# Patient Record
Sex: Male | Born: 1935 | Race: White | Hispanic: No | Marital: Married | Smoking: Never smoker
Health system: Southern US, Community
[De-identification: ages and names within clinical notes are randomized; demographics above are authoritative.]

## PROBLEM LIST (undated history)

## (undated) DIAGNOSIS — I2510 Atherosclerotic heart disease of native coronary artery without angina pectoris: Secondary | ICD-10-CM

## (undated) DIAGNOSIS — H9190 Unspecified hearing loss, unspecified ear: Secondary | ICD-10-CM

## (undated) DIAGNOSIS — I219 Acute myocardial infarction, unspecified: Secondary | ICD-10-CM

## (undated) DIAGNOSIS — I519 Heart disease, unspecified: Secondary | ICD-10-CM

## (undated) DIAGNOSIS — I499 Cardiac arrhythmia, unspecified: Secondary | ICD-10-CM

## (undated) DIAGNOSIS — K769 Liver disease, unspecified: Secondary | ICD-10-CM

## (undated) DIAGNOSIS — H539 Unspecified visual disturbance: Secondary | ICD-10-CM

## (undated) DIAGNOSIS — I509 Heart failure, unspecified: Secondary | ICD-10-CM

## (undated) DIAGNOSIS — E7800 Pure hypercholesterolemia, unspecified: Secondary | ICD-10-CM

## (undated) DIAGNOSIS — E785 Hyperlipidemia, unspecified: Secondary | ICD-10-CM

## (undated) DIAGNOSIS — N200 Calculus of kidney: Secondary | ICD-10-CM

## (undated) HISTORY — DX: Pure hypercholesterolemia, unspecified: E78.00

## (undated) HISTORY — DX: Acute myocardial infarction, unspecified: I21.9

## (undated) HISTORY — DX: Heart failure, unspecified: I50.9

## (undated) HISTORY — DX: Heart disease, unspecified: I51.9

## (undated) HISTORY — DX: Atherosclerotic heart disease of native coronary artery without angina pectoris: I25.10

## (undated) HISTORY — DX: Hyperlipidemia, unspecified: E78.5

## (undated) HISTORY — PX: ELECTROCARDIOGRAM: SHX264

## (undated) HISTORY — PX: CARDIAC CATHETERIZATION: SHX172

## (undated) HISTORY — DX: Cardiac arrhythmia, unspecified: I49.9

## (undated) HISTORY — DX: Liver disease, unspecified: K76.9

## (undated) HISTORY — PX: OTHER SURGICAL HISTORY: SHX169

## (undated) HISTORY — DX: Unspecified visual disturbance: H53.9

## (undated) HISTORY — DX: Calculus of kidney: N20.0

---

## 2001-01-26 ENCOUNTER — Inpatient Hospital Stay (HOSPITAL_COMMUNITY): Admission: AD | Admit: 2001-01-26 | Discharge: 2001-01-29 | Payer: Self-pay | Admitting: Cardiovascular Disease

## 2001-01-27 ENCOUNTER — Encounter: Payer: Self-pay | Admitting: *Deleted

## 2008-01-16 HISTORY — PX: OTHER SURGICAL HISTORY: SHX169

## 2009-10-09 ENCOUNTER — Observation Stay (HOSPITAL_COMMUNITY): Admission: EM | Admit: 2009-10-09 | Discharge: 2009-10-09 | Payer: Self-pay | Admitting: Emergency Medicine

## 2010-03-30 LAB — COMPREHENSIVE METABOLIC PANEL
ALT: 84 U/L — ABNORMAL HIGH (ref 0–53)
AST: 84 U/L — ABNORMAL HIGH (ref 0–37)
Albumin: 3.6 g/dL (ref 3.5–5.2)
Alkaline Phosphatase: 157 U/L — ABNORMAL HIGH (ref 39–117)
BUN: 8 mg/dL (ref 6–23)
CO2: 31 mEq/L (ref 19–32)
Calcium: 9.2 mg/dL (ref 8.4–10.5)
Chloride: 100 mEq/L (ref 96–112)
Creatinine, Ser: 0.81 mg/dL (ref 0.4–1.5)
GFR calc Af Amer: >60 mL/min (ref >60)
GFR calc non Af Amer: >60 mL/min (ref >60)
Glucose, Bld: 109 mg/dL — ABNORMAL HIGH (ref 70–99)
Potassium: 4.3 mEq/L (ref 3.5–5.1)
Sodium: 139 mEq/L (ref 135–145)
Total Bilirubin: 0.7 mg/dL (ref 0.3–1.2)
Total Protein: 7.3 g/dL (ref 6.0–8.3)

## 2010-03-30 LAB — DIFFERENTIAL
Basophils Absolute: 0 10*3/uL (ref 0.0–0.1)
Basophils Relative: 1 % (ref 0–1)
Eosinophils Absolute: 0.2 10*3/uL (ref 0.0–0.7)
Eosinophils Relative: 3 % (ref 0–5)
Lymphocytes Relative: 26 % (ref 12–46)
Lymphs Abs: 1.6 10*3/uL (ref 0.7–4.0)
Monocytes Absolute: 0.6 10*3/uL (ref 0.1–1.0)
Monocytes Relative: 9 % (ref 3–12)
Neutro Abs: 3.8 10*3/uL (ref 1.7–7.7)
Neutrophils Relative %: 61 % (ref 43–77)

## 2010-03-30 LAB — CBC
HCT: 40.5 % (ref 39.0–52.0)
Hemoglobin: 13.8 g/dL (ref 13.0–17.0)
MCH: 33.3 pg (ref 26.0–34.0)
MCHC: 34.1 g/dL (ref 30.0–36.0)
MCV: 97.8 fL (ref 78.0–100.0)
Platelets: 196 10*3/uL (ref 150–400)
RBC: 4.14 MIL/uL — ABNORMAL LOW (ref 4.22–5.81)
RDW: 12.3 % (ref 11.5–15.5)
WBC: 6.2 10*3/uL (ref 4.0–10.5)

## 2010-06-02 NOTE — Discharge Summary (Signed)
[place]. [facility]  Patient:    Manuel Mcdowell, Manuel Mcdowell Visit Number: [id number] MRN: [id number]          Service Type: MED Location: [id number] Attending Physician:  Darlin Priestly Dictated by:   Abelino Derrick, P.A.-C Admit Date:  01/26/2001 Discharge Date: 01/29/2001   CC:         Romeo Rabon, M.D. [place], [place]   Discharge Summary  DISCHARGE DIAGNOSES: 1. Unstable angina, left anterior descending artery PCI and stenting this    admission. 2. Hyperlipidemia. 3. Transient hypotension and bradycardia, stable at discharge.  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  He was admitted to [facility] on 01/23/01, with substernal chest pain.  He was transferred to [facility] for further evaluation.  The patient was seen by Dr. Jenne Campus on admission.  He was continued on heparin, and set up for catheterization and possible intervention.  The patient is noted to be a [employer] Witness, and desired no blood products.  He was informed of the risks of bleeding, possible death, and the patient indicated he was willing to accept this.  CK-MBs and troponins were negative.  The patient underwent catheterization on 01/27/01, by Dr. Elsie Lincoln which revealed a 30% right coronary artery, normal left main, normal circumflex, a 99% proximal left anterior descending artery.  Ejection fraction was 55%.  He did have some anterior hypokinesis.  The patient underwent stenting of the left anterior descending artery with good results, and tolerated this well.  He did have some transient hypotension and bradycardia, but this resolved with fluids.  He is discharged on 01/28/01, and will follow up with Dr. Elsie Lincoln in [place].  DISCHARGE MEDICATIONS: 1. Zetia 10 mg q.d. 2. Plavix 75 mg p.o. q.d. x1 month. 3.  Niacin 2 g h.s. 4. Toprol XL 25 mg q.d. 5. Coated aspirin q.d. 6. Nitroglycerin sublingual p.r.n.  LABORATORY DATA:  EKG showed sinus rhythm, sinus bradycardia, and small inferior Qs, he does have T-wave inversion in V2.  Chest x-ray shows no acute pulmonary process.  White blood cell count 6.6, hemoglobin 16, hematocrit 45.2, platelets 207.  INR 1.  Sodium 139, potassium 3.9, BUN 11, creatinine 0.9.  Liver function tests are normal.  CKs are negative x2.  Lipid profile showed a cholesterol of 298, triglycerides 812, HDL 46.  Followup lipid profile showed triglycerides of 288, HDL 39, LDL 162.  TSH 5.3.  DISPOSITION:  The patient is discharged in stable condition.  FOLLOWUP:  Dr. Elsie Lincoln.  He should be on Plavix for one month.  He will need a followup lipid profile in about two weeks. Dictated by:   Abelino Derrick, P.A.-C Attending Physician:  Darlin Priestly DD:  02/06/01 TD:  02/07/01 Job: [id number]

## 2010-06-02 NOTE — Cardiovascular Report (Signed)
[name]. [facility]  Patient:    Manuel Mcdowell, Manuel Mcdowell Visit Number: [id number] MRN: [id number]          Service Type: MED Location: [id number] Attending Physician:  Darlin Priestly Dictated by:   Madaline Savage, M.D. Proc. Date: 01/27/01 Admit Date:  01/26/2001 Discharge Date: 01/29/2001   CC:         Cardiac Catheterization Laboratory   Cardiac Catheterization  PROCEDURES PERFORMED: 1. Selective coronary angiography by Judkins technique. 2. Retrograde left heart catheterization. 3. Left ventricular angiography. 4. Percutaneous coronary artery stenting of the proximal left anterior    descending coronary artery.  COMPLICATIONS:  None.  ENTRY SITE:  Right femoral.  DYE USED:  Omnipaque.  PATIENT PROFILE:  The patient is a very pleasant 76 year old white married male from [place] who was hospitalized recently at [facility] for unstable angina.  Apparently, cardiac enzymes were negative.  He was given the option of undergoing catheterization there and chose to come to [facility] instead and was transported by emergency medical transport.  He has been hospitalized over the weekend and enters the catheterization laboratory for diagnostic intervention and possible percutaneous intervention as well. Integrilin had been infusing for his stable angina which was fairly stable.  MEDICATIONS GIVEN:  Heparin 3600 units.  DIAGNOSTIC PROCEDURE RESULTS:  HEMODYNAMICS:  Left ventricular pressure was 110/7, central aortic pressure 105/50 with a mean of 75.  No significant aortic valve gradient by pullback technique.  ANGIOGRAPHIC RESULTS: 1. The left main coronary artery contained no significant calcification and    was a smooth vessel, fairly short in length. 2. The left anterior descending artery coursed to the cardiac apex and gave    rise to one medium sized diagonal branch arising in the mid portion of the    vessel and  several smaller diagonal branches that were fairly tiny in size.    The LAD contained a type C lesion of a segmental nature in the proximal    portion of the vessel just before the septal perforator branch.  There    appeared to be some ulceration in the plaque which was fairly complex,    however, the area just beyond the stenotic vessel appeared fairly smooth    and normal in appearance.  The medium sized diagonal branch, although small    to medium in size, did not show any high-grade lesions. 3. The left circumflex was nondominant, showing what appeared to be a    bifurcating major obtuse marginal branch arising near the proximal portion    of the vessel.  The atrioventricular groove circumflex was fairly small in    diameter.  No significant lesions were seen in the circumflex or its obtuse    marginal branch. 4. The right coronary artery contained faint calcification proximally.  There    was a small amount of plaque formation in the proximal portion of the RCA,    well before the first acute marginal branch, but well after the sinus node    branch.  The irregular portion of the vessel here was 20-30% irregular.    The mid and distal RCA, which was a dominant vessel, was smooth and normal.    There were intracoronary collaterals from the septal perforator branches to    the LAD.  These were small to fair sized collaterals.  LEFT VENTRICULOGRAPHY:  The left ventricle showed globally good LV function. I would estimate ejection fraction at 50%.  There was moderate  anterolateral wall hypokinesis involving the lower two-thirds of the anterior wall. Inferior wall apex and basal segments all contracted normally.  No mitral regurgitation seen.  No obvious LV thrombus noted.  PERCUTANEOUS INTERVENTION:  This was performed with 7-French catheters.  The guide catheter used was a 7-French Judkins #4 left.  The guide wire was a high-torque floppy wire, 190 cm in length.  The predilatation  balloon was a 2.5 x 15 mm Maverick balloon, which was successful at creating a satisfactory lumen for stent delivery.  The inflation pressure was to 6 atm to 8 atm for approximately 60 seconds.  Next, a BX Velocity stent, 2.5 x 18 mm, was deployed across the stenotic vessel, making sure that the distal end of the stent arose significantly well before the diagonal branch arising distal to it.  This stent was deployed first to 16 atm for 75 seconds.  I then postdilated with a 3.0 x 15 mm PowerSail balloon at 14 atm for 60 seconds, which gave an excellent result.  There was now noted to be some irregularity in the LAD just distal to the stent, so that same PowerSail balloon was delivered half inside the distal stent and half inside the LAD distal to the stent and inflated to 8 atm for 60 seconds.  After this maneuver was completed, the LAD was viewed in multiple projections.  It was felt that the 95% stenosis in the proximal LAD was reduced to 0% residual.  FINAL DIAGNOSES: 1. Primarily single-vessel coronary artery disease. 2. Well preserved left ventricular systolic function with mild anterolateral    hypokinesis. 3. Successful percutaneous intervention on proximal left anterior    descending artery, with reduction of a 95% to 0% residual with an    intracoronary artery stent.  PLAN:  The patient will be supported with aspirin and Plavix for the next month and he will be followed in the future to make sure his RCA lesion which is early is not hemodynamically significant. Dictated by:   Madaline Savage, M.D. Attending Physician:  Darlin Priestly DD:  03/04/01 TD:  03/04/01 Job: [id number]

## 2012-05-22 IMAGING — CT CT ABD-PELV W/O CM
2 of 4 series · 17 of 46 positions shown, 19 images · non-contrast
Comparison: None

CLINICAL DATA: Back pain

CT ABDOMEN AND PELVIS WITHOUT CONTRAST
TECHNIQUE: Multidetector CT imaging of the abdomen and pelvis was
performed following the standard protocol without intravenous
contrast.

[Series 2: a/p w/o 5.0 b31f st · axial · non-contrast · 0.67mm/px · z∈[+1193,+1608]mm · 14 of 91 slices shown, 16 images]
[im 4/91  soft-tissue]
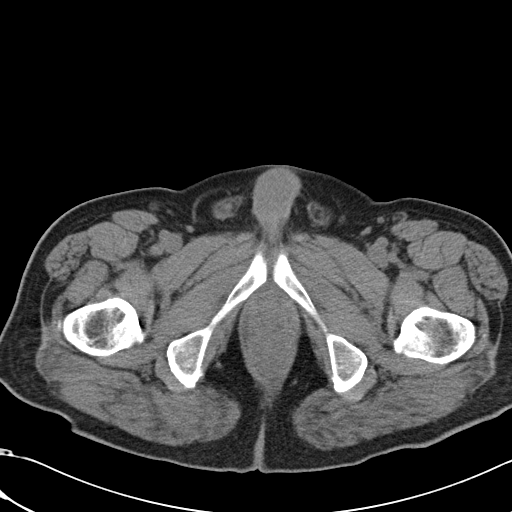
[im 4/91  bone]
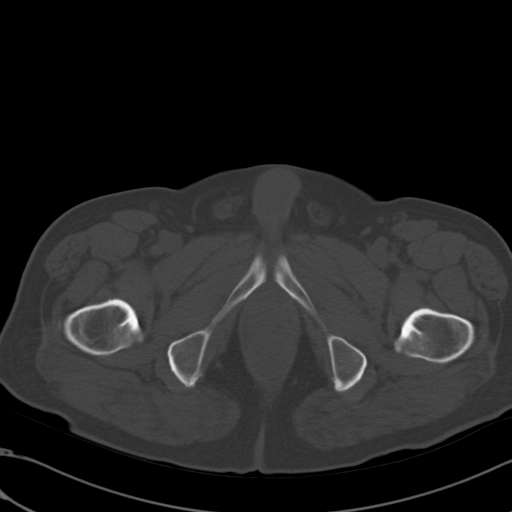
[im 12/91  soft-tissue]
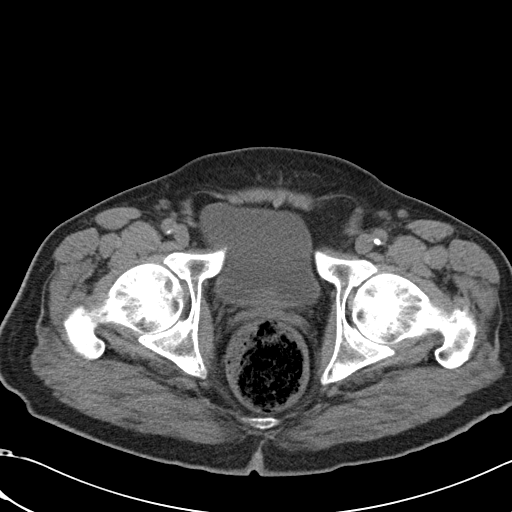
[im 16/91  soft-tissue]
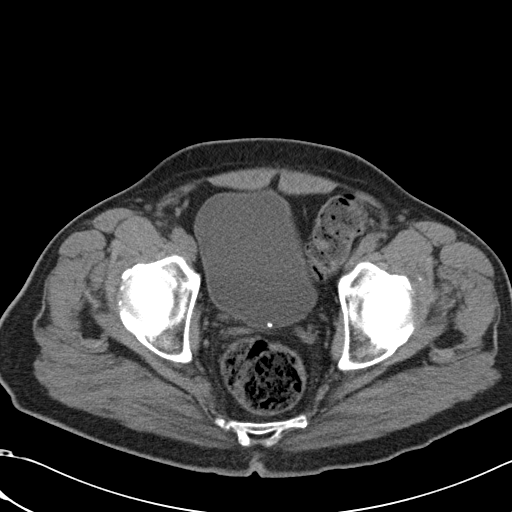
[im 24/91  soft-tissue]
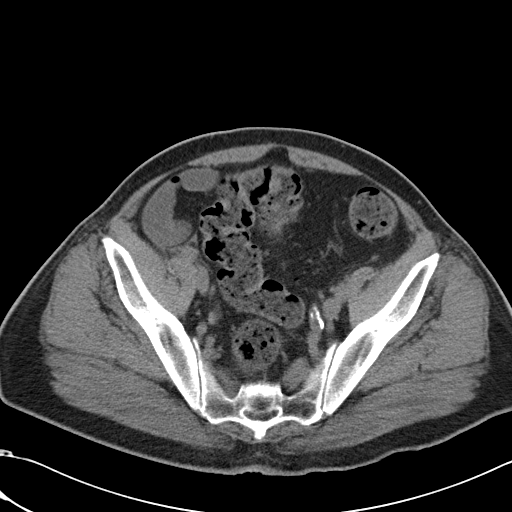
[im 32/91  soft-tissue]
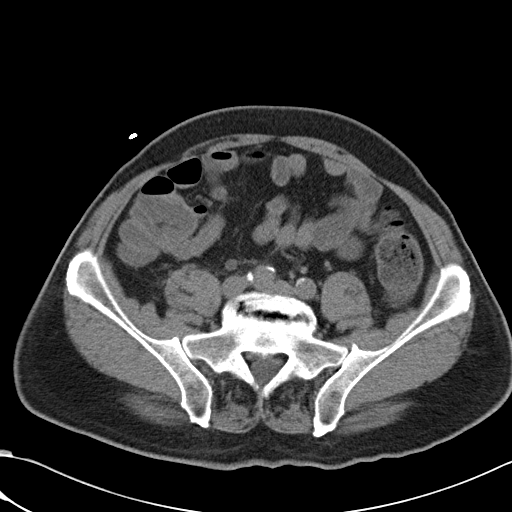
[im 36/91  soft-tissue]
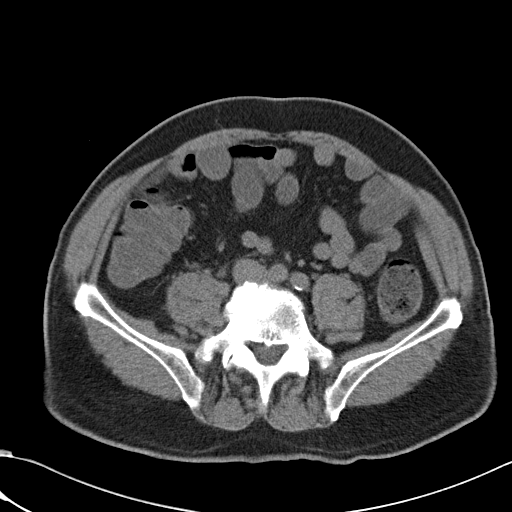
[im 44/91  soft-tissue]
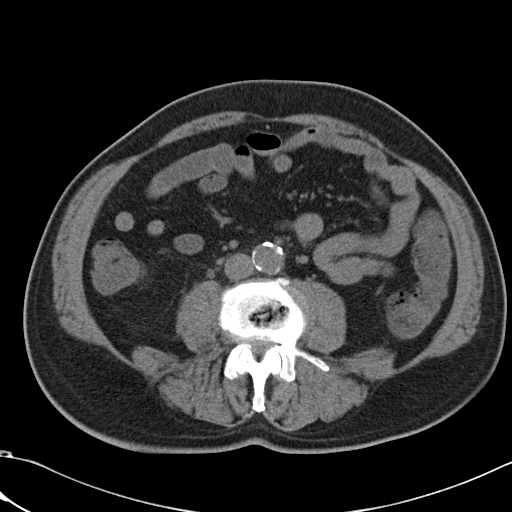
[im 47/91  soft-tissue]
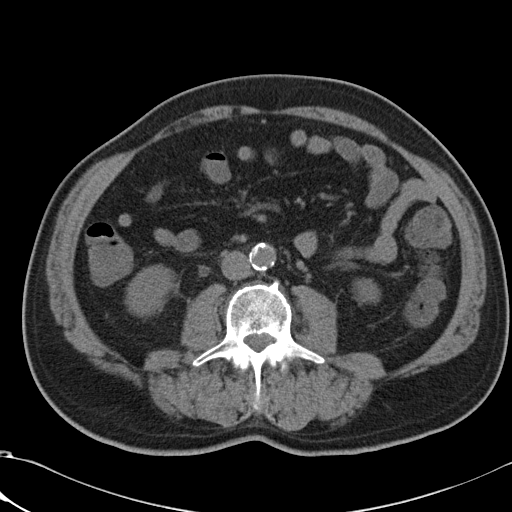
[im 55/91  soft-tissue]
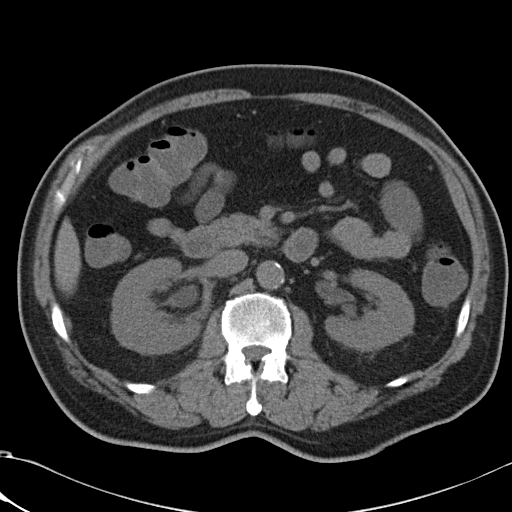
[im 55/91  bone]
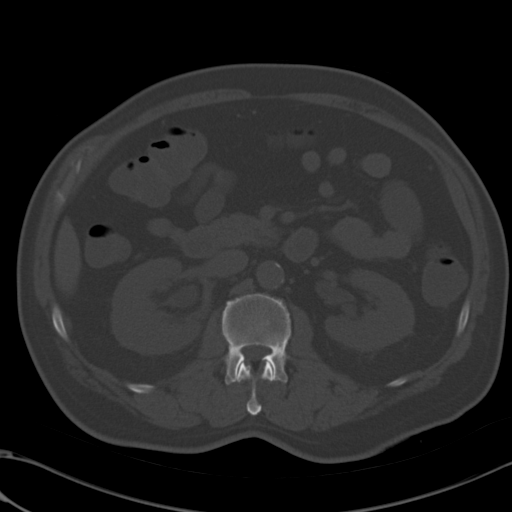
[im 59/91  soft-tissue]
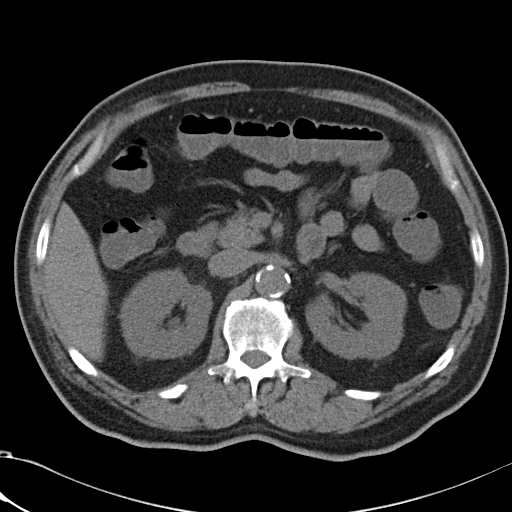
[im 67/91  soft-tissue]
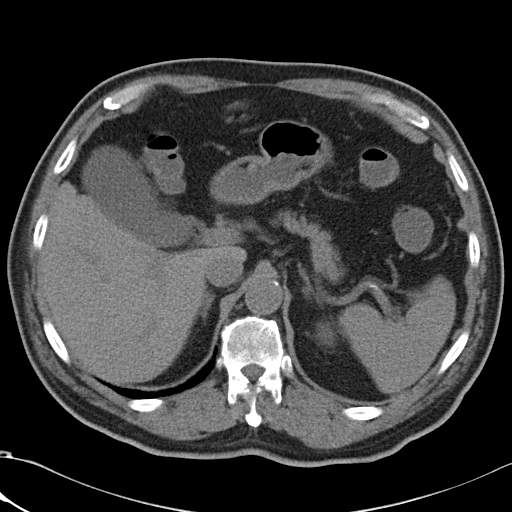
[im 75/91  soft-tissue]
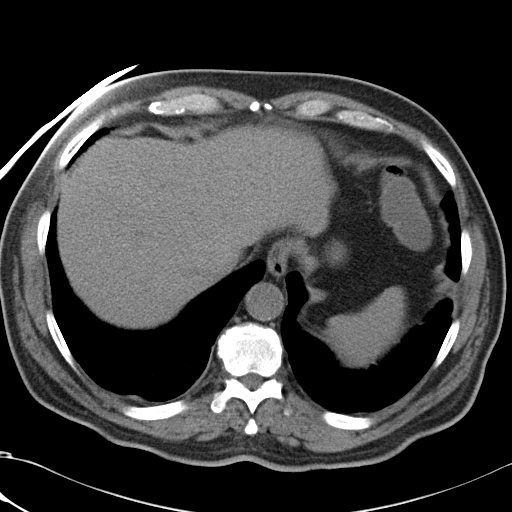
[im 79/91  soft-tissue]
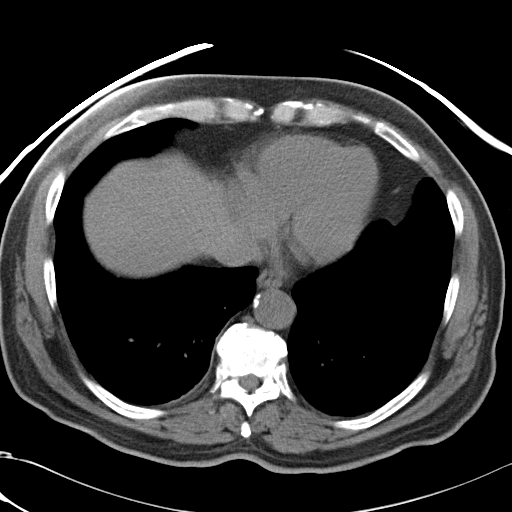
[im 87/91  soft-tissue]
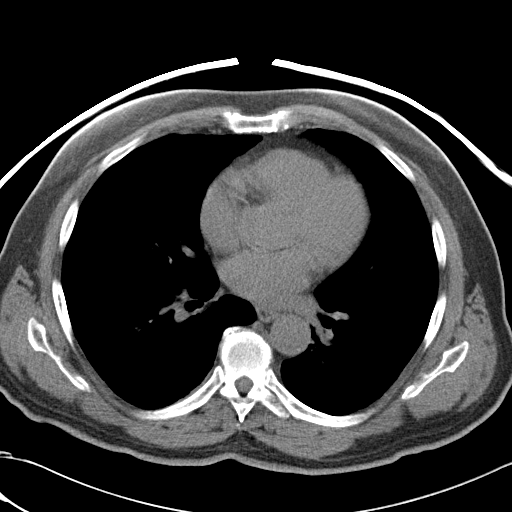

[Series 602: cor · coronal · 0.89mm/px · 3 of 123 slices shown]
[im 41/123  soft-tissue]
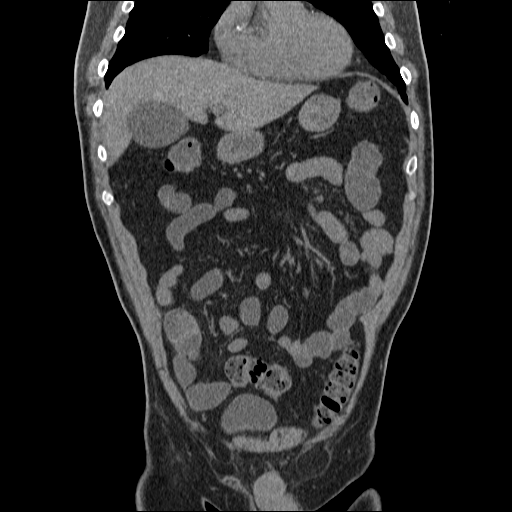
[im 55/123  soft-tissue]
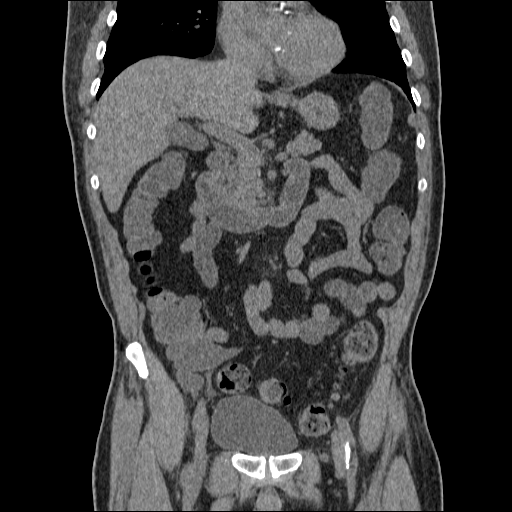
[im 68/123  soft-tissue]
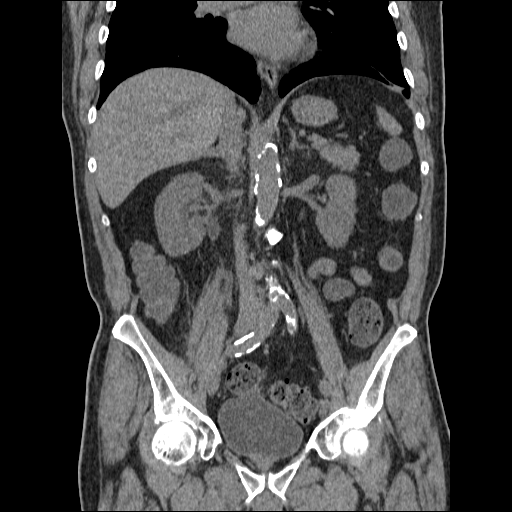

[17 of 46 positions shown; findings below may reference images not displayed]

FINDINGS: Visualized lung bases are clear.

Liver is normal.

Spleen is normal.

Pancreas is normal.

The adrenal glands are normal.

 The left kidney appears normal.

Within the upper pole of the right kidney there is a stone
measuring 4 mm, image 35. There is right-sided pelvocaliectasis and
right hydroureter.  No ureterolithiasis noted.

Within the dependent portion of the bladder there is a stone
measuring 3.8 mm.

Gallbladder is unremarkable by CT.

No biliary ductal dilation.

Stomach and visualized large and small bowel are unremarkable.

Advanced calcified atherosclerotic disease affects the abdominal
aorta.  No aneurysm identified.

No significant lymphadenopathy.

No free fluid or abnormal fluid collections.

The pelvic bowel loops appear normal.

No enlarged pelvic lymph nodes.

There is a fat containing left inguinal hernia.
IMPRESSION: 1.  Stone within the dependent portion of the urinary bladder is
identified.  There is  right-sided pelvocaliectasis and right
hydroureter.
2.  4 mm nonobstructing stone is identified within the upper pole
the right kidney.

## 2012-05-22 IMAGING — US US ABDOMEN COMPLETE
1 series · 14 of 25 positions shown · non-contrast
Comparison: CT stone study 10/09/2009

CLINICAL DATA: Kidney stone.  The right upper quadrant pain.

ABDOMINAL ULTRASOUND COMPLETE

[Series 1: us abdomen complete · 0.30mm/px · 14 of 50 slices shown]
[im 1/50]
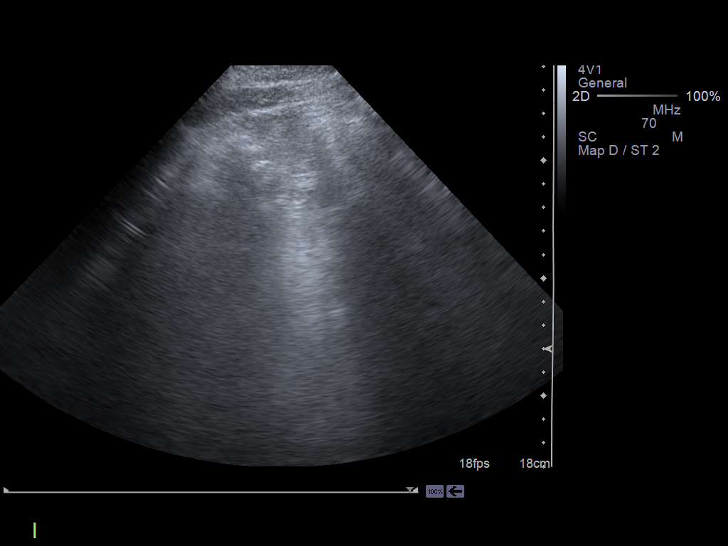
[im 5/50]
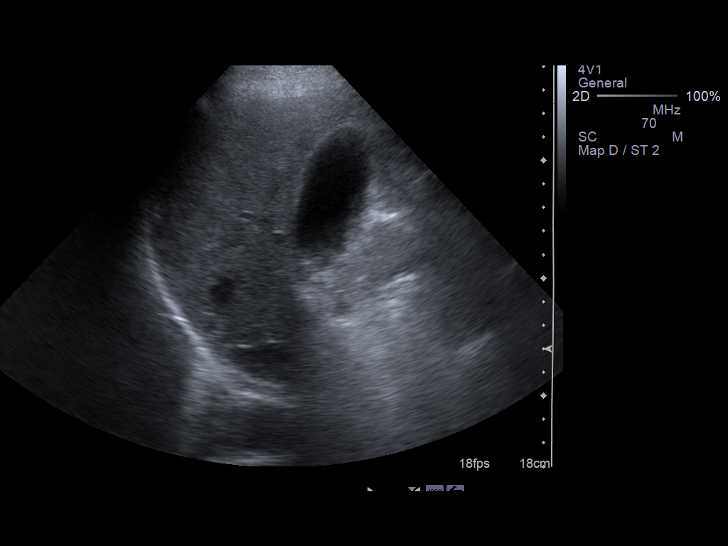
[im 9/50]
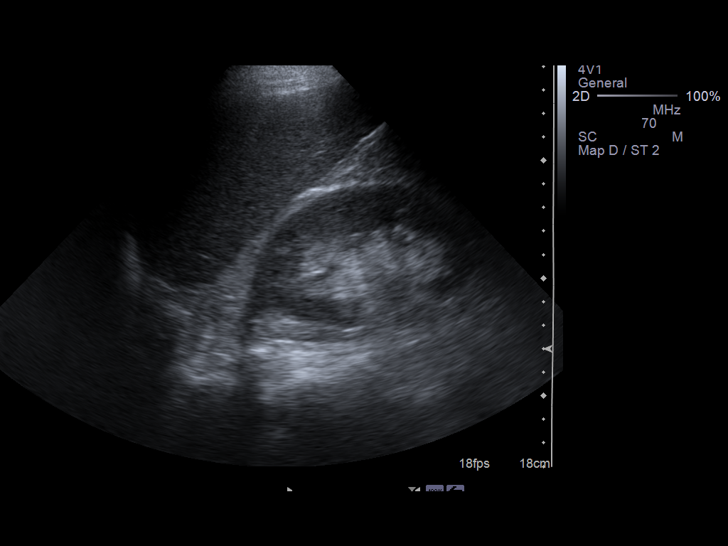
[im 13/50]
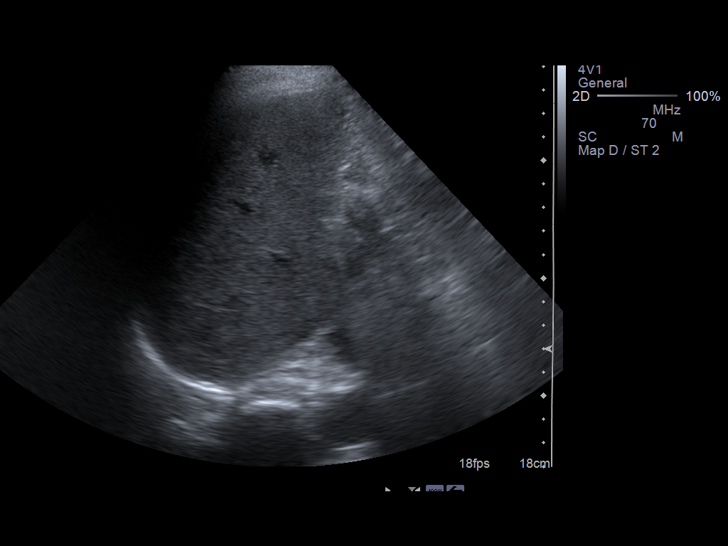
[im 17/50]
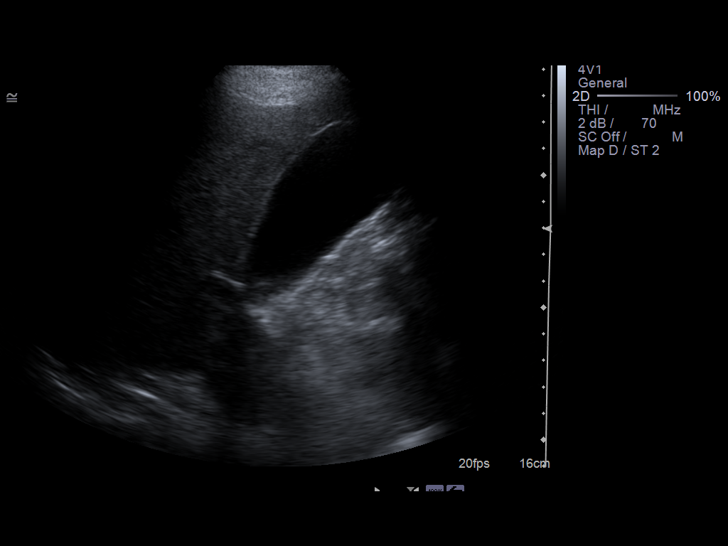
[im 19/50]
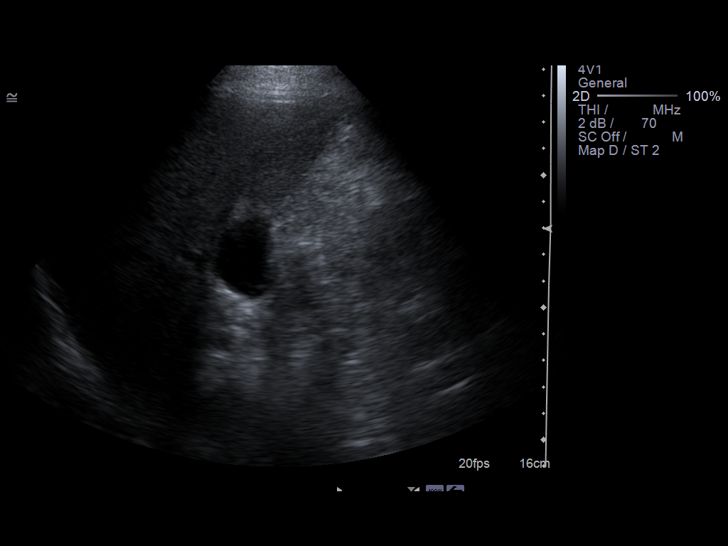
[im 23/50]
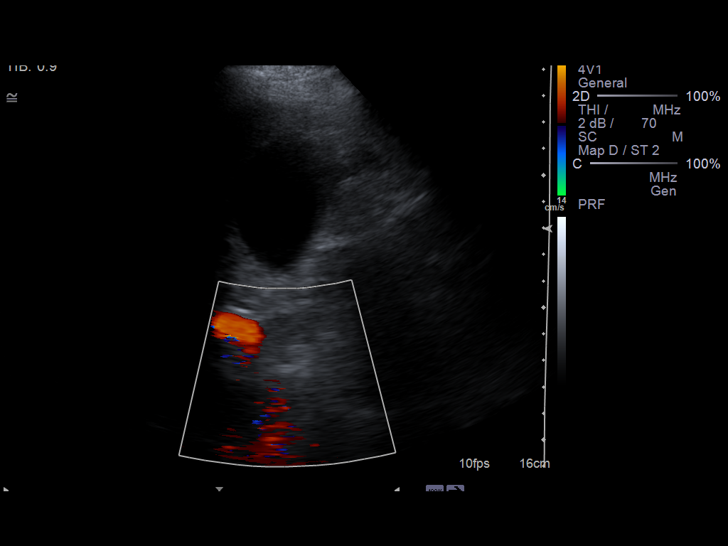
[im 27/50]
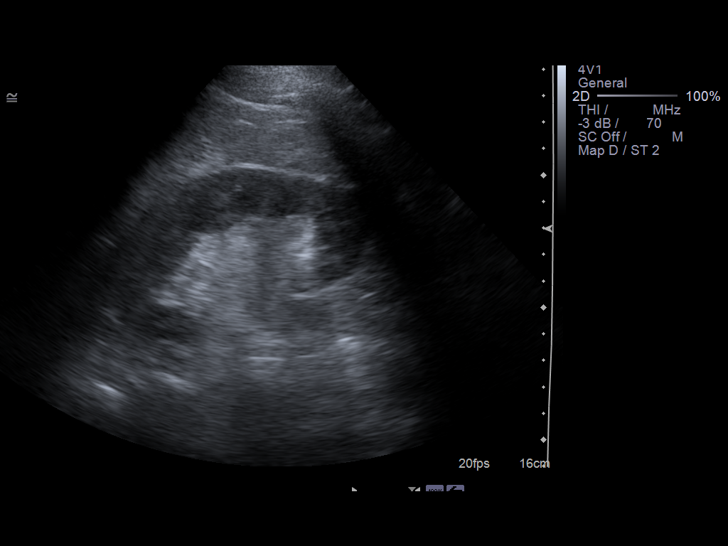
[im 31/50]
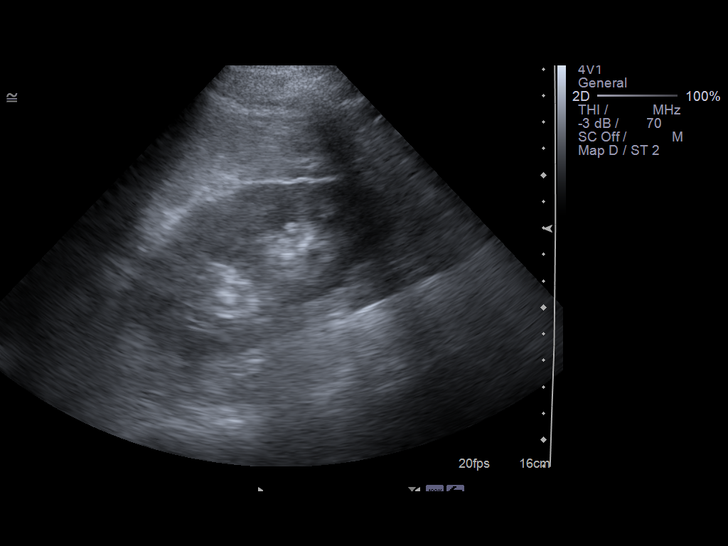
[im 33/50]
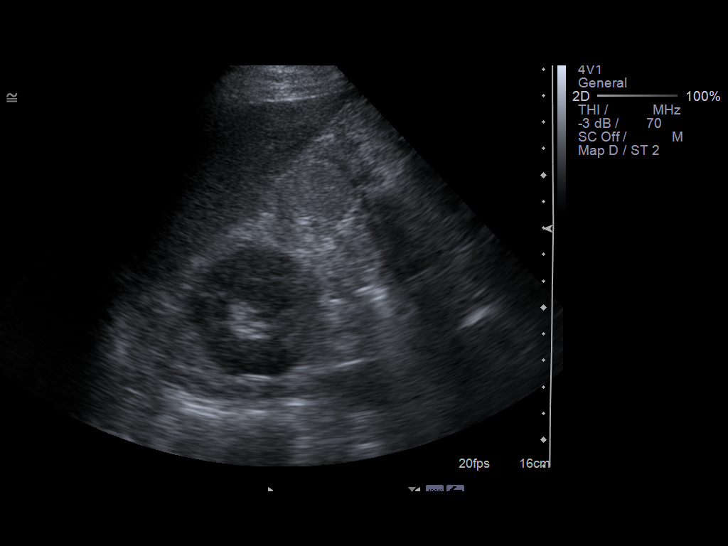
[im 37/50]
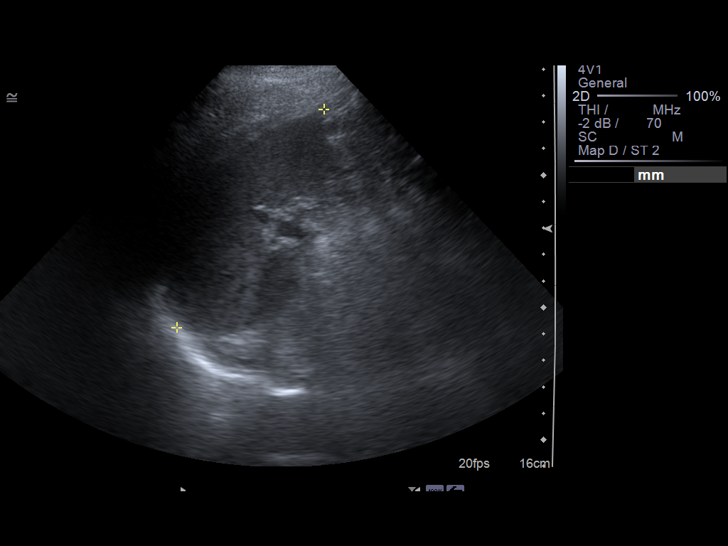
[im 41/50]
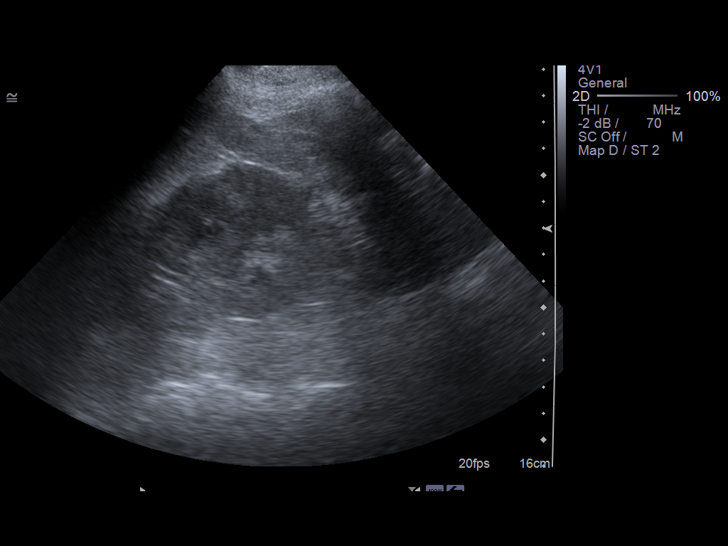
[im 45/50]
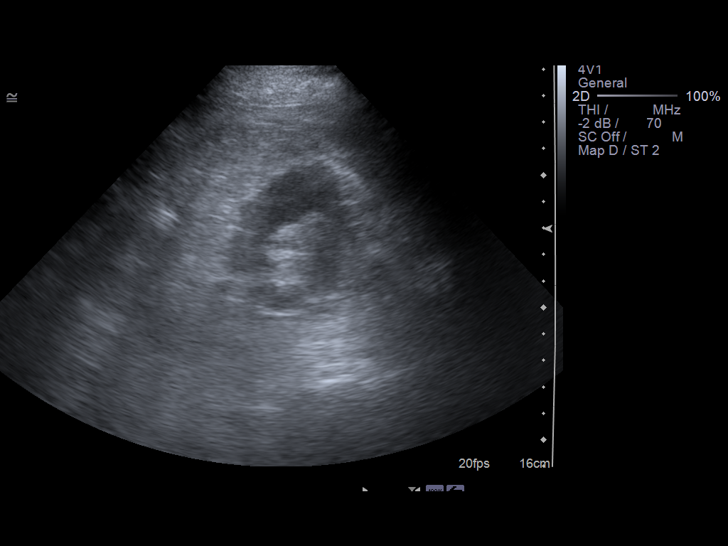
[im 50/50]
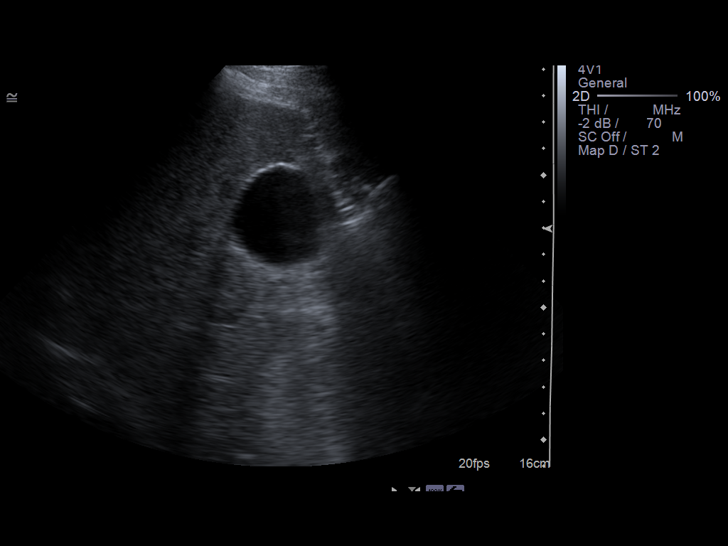

[14 of 25 positions shown; findings below may reference images not displayed]

FINDINGS: Patient was not n.p.o. for this examination.  Prominent amount of
bowel gas was noted by the technologist.

Gallbladder:  No gallstones, gallbladder wall thickening, or
pericholecystic fluid.

Common Bile Duct:  Within normal limits in caliber.

Liver: No focal mass lesion identified.  Within normal limits in
parenchymal echogenicity.

IVC:  Appears normal.

Pancreas:  No abnormality identified.

Spleen:  Within normal limits in size and echotexture.

Right kidney:  Normal in size and parenchymal echogenicity.  No
evidence of mass.  Mild pelviectasis.  Negative for hydronephrosis.

Left kidney:  Normal in size and parenchymal echogenicity.  No
evidence of mass or hydronephrosis.

Abdominal Aorta:  Not visualized.
IMPRESSION: Technically challenging due to bowel gas.

1.  Pelviectasis of the right kidney.  No dilatation of the
calyces.
2.  Known right renal stones seen on today's CT stone study is not
well visualized on ultrasound.
3.  Gallbladder within normal limits.  No biliary ductal
dilatation.

## 2012-12-02 ENCOUNTER — Encounter: Payer: Self-pay | Admitting: Cardiovascular Disease

## 2012-12-02 ENCOUNTER — Ambulatory Visit (INDEPENDENT_AMBULATORY_CARE_PROVIDER_SITE_OTHER): Payer: Medicare Other | Admitting: Cardiovascular Disease

## 2012-12-02 VITALS — BP 116/70 | HR 55 | Resp 12 | Ht 66.0 in | Wt 157.6 lb

## 2012-12-02 DIAGNOSIS — E785 Hyperlipidemia, unspecified: Secondary | ICD-10-CM

## 2012-12-02 DIAGNOSIS — I2510 Atherosclerotic heart disease of native coronary artery without angina pectoris: Secondary | ICD-10-CM

## 2012-12-02 NOTE — Patient Instructions (Signed)
Dr. Croitoru recommends that you schedule a follow-up appointment in: One year.   

## 2012-12-06 ENCOUNTER — Encounter: Payer: Self-pay | Admitting: Cardiovascular Disease

## 2012-12-06 DIAGNOSIS — E7800 Pure hypercholesterolemia, unspecified: Secondary | ICD-10-CM | POA: Insufficient documentation

## 2012-12-06 DIAGNOSIS — I2510 Atherosclerotic heart disease of native coronary artery without angina pectoris: Secondary | ICD-10-CM

## 2012-12-06 DIAGNOSIS — E785 Hyperlipidemia, unspecified: Secondary | ICD-10-CM

## 2012-12-06 HISTORY — DX: Atherosclerotic heart disease of native coronary artery without angina pectoris: I25.10

## 2012-12-06 HISTORY — DX: Hyperlipidemia, unspecified: E78.5

## 2012-12-06 NOTE — Progress Notes (Signed)
Patient ID: Manuel Mcdowell, male   DOB: June 18, 1934, 78 y.o.   MRN: [id number]      Reason for office visit CAD, hyperlipidemia  Despite his intolerance to almost any lipid lowering treatment, 11 years have passed uneventfully since Mr. Lindo received his LAD stent. He continues to work very hard in his yard and has no cardiovascular complaints.  Attempts at treatment with simvastatin, pravastatin, rosuvastatin, Welchol, Zetia and Niacin were all unsuccessful due to side effects. He does tolerate red yeast rice with CoQ10. He has resting bradycardia and is intolerant to beta blockers.   Allergies  Allergen Reactions  . Statins Anaphylaxis    Myalgias   . Sulfa Antibiotics Hives    Current Outpatient Prescriptions  Medication Sig Dispense Refill  . aspirin 325 MG tablet Take 325 mg by mouth daily.      . cholecalciferol (VITAMIN D) 400 UNITS TABS tablet Take 400 Units by mouth daily.      Marland Kitchen CINNAMON PO Take by mouth daily.      . Flaxseed, Linseed, (FLAX SEED OIL PO) Take 2 tablets by mouth daily.      . Probiotic Product (PROBIOTIC DAILY PO) Take by mouth daily.      . Red Yeast Rice 600 MG CAPS Take 2 capsules by mouth daily.       No current facility-administered medications for this visit.    Past Medical History  Diagnosis Date  . CAD (coronary artery disease) s/p LAD stent 2003 12/06/2012    LAD stent (bare metal BXVelocity 2.5x18), 2003  . Hyperlipidemia 12/06/2012    Statin intolerant - myalgia and LFT elevation, Zetia intolerant -myalgia, Niacin intolerant    No past surgical history on file.  No family history on file.  History   Social History  . Marital Status: Married    Spouse Name: N/A    Number of Children: N/A  . Years of Education: N/A   Occupational History  . Not on file.   Social History Main Topics  . Smoking status: Never Smoker   . Smokeless tobacco: Not on file  . Alcohol Use: Yes     Comment: occas. wine  . Drug Use: No  . Sexual  Activity: Not on file   Other Topics Concern  . Not on file   Social History Narrative  . No narrative on file    Review of systems: The patient specifically denies any chest pain at rest or with exertion, dyspnea at rest or with exertion, orthopnea, paroxysmal nocturnal dyspnea, syncope, palpitations, focal neurological deficits, intermittent claudication, lower extremity edema, unexplained weight gain, cough, hemoptysis or wheezing.  The patient also denies abdominal pain, nausea, vomiting, dysphagia, diarrhea, constipation, polyuria, polydipsia, dysuria, hematuria, frequency, urgency, abnormal bleeding or bruising, fever, chills, unexpected weight changes, mood swings, change in skin or hair texture, change in voice quality, auditory or visual problems, allergic reactions or rashes, new musculoskeletal complaints other than usual "aches and pains".   PHYSICAL EXAM BP 116/70  Pulse 55  Resp 12  Ht 5\' 6"  (1.676 m)  Wt 157 lb 9.6 oz (71.487 kg)  BMI 25.45 kg/m2  General: Alert, oriented x3, no distress Head: no evidence of trauma, PERRL, EOMI, no exophtalmos or lid lag, no myxedema, no xanthelasma; normal ears, nose and oropharynx Neck: normal jugular venous pulsations and no hepatojugular reflux; brisk carotid pulses without delay and no carotid bruits Chest: clear to auscultation, no signs of consolidation by percussion or palpation, normal fremitus, symmetrical and full  respiratory excursions Cardiovascular: normal position and quality of the apical impulse, regular rhythm, normal first and second heart sounds, no murmurs, rubs or gallops Abdomen: no tenderness or distention, no masses by palpation, no abnormal pulsatility or arterial bruits, normal bowel sounds, no hepatosplenomegaly Extremities: no clubbing, cyanosis or edema; 2+ radial, ulnar and brachial pulses bilaterally; 2+ right femoral, posterior tibial and dorsalis pedis pulses; 2+ left femoral, posterior tibial and dorsalis  pedis pulses; no subclavian or femoral bruits Neurological: grossly nonfocal   EKG: Sinus bradycardia, normal  Lipid Panel Chol 250, LDL 178, HDL 47 TG 122   BMET    Component Value Date/Time   NA 139 10/09/2009 1716   K 4.3 10/09/2009 1716   CL 100 10/09/2009 1716   CO2 31 10/09/2009 1716   GLUCOSE 109* 10/09/2009 1716   BUN 8 10/09/2009 1716   CREATININE 0.81 10/09/2009 1716   CALCIUM 9.2 10/09/2009 1716   GFRNONAA >60 10/09/2009 1716   GFRAA  Value: >60        The eGFR has been calculated using the MDRD equation. This calculation has not been validated in all clinical situations. eGFR's persistently <60 mL/min signify possible Chronic Kidney Disease. 10/09/2009 1716     ASSESSMENT AND PLAN Mr. Hanken has succeeded in staving off new arterial problems despite an unfavorable lipid profile. This is probably attributable to his attention to diet and daily intense physical activity He is asymptomatic. No changes are made to his medications. When PSCK9 agents become available for clinical use, he should try them.  Orders Placed This Encounter  Procedures  . EKG 12-Lead   Meds ordered this encounter  Medications  . aspirin 325 MG tablet    Sig: Take 325 mg by mouth daily.  . Flaxseed, Linseed, (FLAX SEED OIL PO)    Sig: Take 2 tablets by mouth daily.  . Red Yeast Rice 600 MG CAPS    Sig: Take 2 capsules by mouth daily.  . Probiotic Product (PROBIOTIC DAILY PO)    Sig: Take by mouth daily.  Marland Kitchen CINNAMON PO    Sig: Take by mouth daily.  . cholecalciferol (VITAMIN D) 400 UNITS TABS tablet    Sig: Take 400 Units by mouth daily.    Junious Silk, MD, [facility] [phone] office [phone] pager

## 2012-12-08 ENCOUNTER — Encounter: Payer: Self-pay | Admitting: Cardiovascular Disease

## 2013-12-14 ENCOUNTER — Ambulatory Visit: Payer: Medicare Other | Admitting: Cardiovascular Disease

## 2014-01-27 ENCOUNTER — Ambulatory Visit: Payer: Medicare Other | Admitting: Cardiovascular Disease

## 2014-03-11 ENCOUNTER — Encounter: Payer: Self-pay | Admitting: Cardiovascular Disease

## 2014-03-11 ENCOUNTER — Ambulatory Visit (INDEPENDENT_AMBULATORY_CARE_PROVIDER_SITE_OTHER): Payer: Medicare Other | Admitting: Cardiovascular Disease

## 2014-03-11 VITALS — BP 116/72 | HR 72 | Resp 16 | Ht 66.0 in | Wt 166.5 lb

## 2014-03-11 DIAGNOSIS — I2510 Atherosclerotic heart disease of native coronary artery without angina pectoris: Secondary | ICD-10-CM

## 2014-03-11 DIAGNOSIS — Z79899 Other long term (current) drug therapy: Secondary | ICD-10-CM

## 2014-03-11 DIAGNOSIS — E785 Hyperlipidemia, unspecified: Secondary | ICD-10-CM

## 2014-03-11 NOTE — Patient Instructions (Signed)
Your physician recommends that you return for lab work in: FASTING at your convenience.  Dr. Royann Shiversroitoru recommends that you schedule a follow-up appointment in: May 2017 same day as your wife's appointment.

## 2014-03-12 ENCOUNTER — Encounter: Payer: Self-pay | Admitting: Cardiovascular Disease

## 2014-03-12 NOTE — Progress Notes (Signed)
Patient ID: Manuel Mcdowell, male   DOB: 1934/08/20, 80 y.o.   MRN: [id number]      Reason for office visit CAD  Manuel Mcdowell is here for a routine yearly visit and remains asymptomatic. 13 years have passed since he received an LAD stent. Attempts at treatment with simvastatin, pravastatin, rosuvastatin, Welchol, Zetia and Niacin were all unsuccessful due to side effects. He does tolerate red yeast rice with CoQ10. He has resting bradycardia and is intolerant to beta blockers.   Allergies  Allergen Reactions  . Statins Anaphylaxis    Myalgias   . Sulfa Antibiotics Hives    Current Outpatient Prescriptions  Medication Sig Dispense Refill  . aspirin 325 MG tablet Take 325 mg by mouth daily.    . cholecalciferol (VITAMIN D) 400 UNITS TABS tablet Take 400 Units by mouth daily.    Marland Kitchen CINNAMON PO Take by mouth daily.    . Flaxseed, Linseed, (FLAX SEED OIL PO) Take 2 tablets by mouth daily.    Marland Kitchen MILK THISTLE PO Take 600 mg by mouth 2 (two) times daily.    . Probiotic Product (PROBIOTIC DAILY PO) Take by mouth daily.    . Red Yeast Rice 600 MG CAPS Take 2 capsules by mouth daily.     No current facility-administered medications for this visit.    Past Medical History  Diagnosis Date  . CAD (coronary artery disease) s/p LAD stent 2003 12/06/2012    LAD stent (bare metal BXVelocity 2.5x18), 2003  . Hyperlipidemia 12/06/2012    Statin intolerant - myalgia and LFT elevation, Zetia intolerant -myalgia, Niacin intolerant  . Heart disease   . Liver disease   . Visual disorder   . High cholesterol   . CHF (congestive heart failure)   . Myocardial infarction   . Arrhythmia   . Hypercholesterolemia     severe    Past Surgical History  Procedure Laterality Date  . Cardiac catheterization      stent place in his LAD artery  . Electrocardiogram      normal  . Stress dipyridamole myocardial perfusion  2010  . Exercise myocardiolite      No family history on file.  History   Social  History  . Marital Status: Married    Spouse Name: N/A  . Number of Children: N/A  . Years of Education: N/A   Occupational History  . Not on file.   Social History Main Topics  . Smoking status: Never Smoker   . Smokeless tobacco: Not on file  . Alcohol Use: Yes     Comment: occas. wine  . Drug Use: No  . Sexual Activity: Not on file   Other Topics Concern  . Not on file   Social History Narrative    Review of systems: The patient specifically denies any chest pain at rest or with exertion, dyspnea at rest or with exertion, orthopnea, paroxysmal nocturnal dyspnea, syncope, palpitations, focal neurological deficits, intermittent claudication, lower extremity edema, unexplained weight gain, cough, hemoptysis or wheezing.  The patient also denies abdominal pain, nausea, vomiting, dysphagia, diarrhea, constipation, polyuria, polydipsia, dysuria, hematuria, frequency, urgency, abnormal bleeding or bruising, fever, chills, unexpected weight changes, mood swings, change in skin or hair texture, change in voice quality, auditory or visual problems, allergic reactions or rashes, new musculoskeletal complaints other than usual "aches and pains".   PHYSICAL EXAM BP 116/72 mmHg  Pulse 72  Resp 16  Ht 5' 6"  (1.676 m)  Wt 166 lb 8 oz (75.524 kg)  BMI 26.89 kg/m2 General: Alert, oriented x3, no distress Head: no evidence of trauma, PERRL, EOMI, no exophtalmos or lid lag, no myxedema, no xanthelasma; normal ears, nose and oropharynx Neck: normal jugular venous pulsations and no hepatojugular reflux; brisk carotid pulses without delay and no carotid bruits Chest: clear to auscultation, no signs of consolidation by percussion or palpation, normal fremitus, symmetrical and full respiratory excursions Cardiovascular: normal position and quality of the apical impulse, regular rhythm, normal first and second heart sounds, no murmurs, rubs or gallops Abdomen: no tenderness or distention, no masses  by palpation, no abnormal pulsatility or arterial bruits, normal bowel sounds, no hepatosplenomegaly Extremities: no clubbing, cyanosis or edema; 2+ radial, ulnar and brachial pulses bilaterally; 2+ right femoral, posterior tibial and dorsalis pedis pulses; 2+ left femoral, posterior tibial and dorsalis pedis pulses; no subclavian or femoral bruits Neurological: grossly nonfocal  EKG: sinus braycardia  Lipid Panel  Chol 250, LDL 178, HDL 47 TG 122  BMET    Component Value Date/Time   NA 139 10/09/2009 1716   K 4.3 10/09/2009 1716   CL 100 10/09/2009 1716   CO2 31 10/09/2009 1716   GLUCOSE 109* 10/09/2009 1716   BUN 8 10/09/2009 1716   CREATININE 0.81 10/09/2009 1716   CALCIUM 9.2 10/09/2009 1716   GFRNONAA >60 10/09/2009 1716   GFRAA  10/09/2009 1716    >60        The eGFR has been calculated using the MDRD equation. This calculation has not been validated in all clinical situations. eGFR's persistently <60 mL/min signify possible Chronic Kidney Disease.     ASSESSMENT AND PLAN  He has done quite well with mostly lifestyle changes and has not had new events despite a very high LDL-C. PCSK-9 inhibitors are considered, but he clearly prefers a more natural, conservative approach. Continue current care - encourage some additional weight loss - he is mildly overweight.  Orders Placed This Encounter  Procedures  . Lipid panel  . Comprehensive metabolic panel  . EKG 12-Lead   Meds ordered this encounter  Medications  . MILK THISTLE PO    Sig: Take 600 mg by mouth 2 (two) times daily.    Holli Humbles, MD, [place] [phone] office [phone] pager

## 2014-03-22 ENCOUNTER — Encounter: Payer: Self-pay | Admitting: Cardiovascular Disease

## 2015-06-22 ENCOUNTER — Ambulatory Visit: Payer: Medicare Other | Admitting: Cardiovascular Disease

## 2015-07-14 ENCOUNTER — Emergency Department
Admission: EM | Admit: 2015-07-14 | Discharge: 2015-07-14 | Disposition: A | Discharge status: Home / Self Care | Payer: Medicare Other | Attending: Emergency Medicine | Admitting: Emergency Medicine

## 2015-07-14 ENCOUNTER — Emergency Department: Payer: Medicare Other

## 2015-07-14 DIAGNOSIS — N201 Calculus of ureter: Secondary | ICD-10-CM | POA: Insufficient documentation

## 2015-07-14 DIAGNOSIS — R110 Nausea: Secondary | ICD-10-CM | POA: Insufficient documentation

## 2015-07-14 LAB — VH URINALYSIS WITH MICROSCOPIC AND CULTURE IF INDICATED
Bilirubin, UA: NEGATIVE mg/dL
Glucose, UA: NEGATIVE mg/dL
Ketones UA: NEGATIVE mg/dL
Leukocyte Esterase, UA: NEGATIVE Leu/uL
Nitrite, UA: NEGATIVE
Protein, UR: NEGATIVE mg/dL
Urine Specific Gravity: 1.02 (ref 1.001–1.040)
Urobilinogen, UA: 0.2 mg/dL
pH, Urine: 6 pH (ref 5.0–8.0)

## 2015-07-14 MED ORDER — OXYCODONE-ACETAMINOPHEN 5-325 MG PO TABS
1.0000 | ORAL_TABLET | ORAL | Status: DC | PRN
Start: 2015-07-14 — End: 2017-05-02

## 2015-07-14 NOTE — Discharge Instructions (Signed)
Kidney Stone (with Pain)    The sharp cramping pain on either side of your lower back and nausea/vomiting that you have are because ofa small stone that has formed in the kidney. It is now passing down a narrow tube (ureter) on its way to your bladder. Once the stone reaches your bladder, the pain will often stop. But it may come back as the stone continues to pass out of the bladder and through the urethra. The stone may pass in your urine stream in one piece. The size may be 1/16 inch to 1/4 inch (1 mm to 6 mm). Or, the stone may break up into sandy fragments that you may not even notice.  Once you have had a kidney stone, you are at risk of getting another one in the future. There are 4 types of kidney stones. Eighty percent are calcium stones--mostly calcium oxalate but also some with calcium phosphate. The other 3 types include uric acid stones, struvite stones (from a preceding infection), and rarely, cystine stones.  Most stones will pass on their own, but may take from a few hours to a few days. Sometimes the stone is too large to pass by itself. In that case, the healthcare provider will need to useother ways to remove the stone. These techniques include:   Lithotripsy. Thisuses ultrasound waves to break up the stone.   Ureteroscopy. Thispushes a basket-like instrument through the urethra and bladder and into the ureter to pull out the stone.   Various types of direct surgery through the skin  Home care  The following are general care guidelines:   Drink plenty of fluids. This means at least 12, 8-ounce glasses of fluid--mostly water--a day.   Each time you urinate, do so in a jar. Pour the urine from the jar through the strainer and into the toilet. Continue doing this until 24 hours after your pain stops. By then, if there was a kidney stone, it should pass from your bladder. Some stones dissolve into sand-like particles and pass right through the strainer. In that case, you won't ever see a  stone.   Save any stone that you find in the strainer and bring it to your healthcare provider to look at. It may be possible to stop certain types of stones from forming. For this reason, it is important to know what kind of stone you have.   Try to stay as active as possible. This will help the stone pass. Don't stay in bed unless your pain keeps you from getting up. You may notice a red, pink, or brown color to your urine. This is normal while passing a kidney stone.   If you develop pain, you may take ibuprofen or naproxen for pain, unless another medicine was prescribed.If you have chronic liver or kidney disease, talk with yourhealthcare provider before taking these medicines. Also talk with your provider if you'vehad a stomach ulcer or GI bleeding.  Preventingstones  Each year for the next 5 to7 years,you are at risk thatanew stone will form. Your risk is a 50% chance over this time period.The risk is higherif you have a family history of kidney stones or have certain chronic illnesses like hypertension, obesity, ordiabetes.But you can makechanges to your lifestyle and diet that can lower yourrisk for another stone.  Most kidney stones are made of calcium. The following is advice for preventing anothercalcium stone.If you don't know the type of stone you have, follow this advice until the cause of your stone   is found.  Things that help:   The most important thing you can do is to drink plenty of fluids each day. See home care above.   Eat foods that contain phytates. These includewheat, rice, rye, barley, and beans. Phytates are substances that may lower your risk forany type of stone to form.   Eat more fruits and vegetables.Choose those that arehigh in potassium.   Eat foods high in natural citrate likefruit and low-sugar fruit juices.   Having too little calcium in your diet can put you at risk forcalciumkidney stones. Eat a normal amount of calcium in your diet and  talkwith your healthcare providerif you are taking calcium supplements. Cutting back on your calcium intake may raise your risk.New research shows that eating calcium-rich and oxalate-rich foods together lowers your risk for stones by binding the minerals in the stomach and intestines before they can reach the kidneys.    Limit salt intake to 2grams (1teaspoon) per day. Use limited amounts when cooking, and don't add salt at the table.Processed and canned foods are usually high in salt.   Spinach, rhubarb, peanuts, cashews, almonds, grapefruit, and grapefruit juice are all high oxalate foods. You should limit how much of these you eat. Oreat them withcalcium-rich foods. These include dairy products, dark leafy greens, soy products, and calcium-enriched foods.   Reducing the amount of animal meat and high protein foods in your diet may lower your risk for uric acid stones.   Avoidexcess sugar (sucrose) and fructose (sweetener in many soft drinks) inyourdiet.   If you take vitamin C as a supplement, don't take more than 1,000 mg a day.   A dietitian or your healthcare provider can give youinformation aboutchanges in your diet that will help prevent morekidney stones from forming.  Follow-up care  Follow up with yourhealthcare provider, or as advised,if the pain lasts more than 48 hours. Talk with your provider about urine and blood tests to find out the cause of your stone. If you had an X-ray, CT scan, or other diagnostic test, you will be told of any new findings that may affect your care.  Call 911  Call 911 if you have any of these:   Weakness, dizziness, or fainting  When to seek medical advice  Call your healthcare provider right away if any of these occur:   Pain that is not controlled by the medicine given   Repeated vomiting or unable to keep down fluids   Fever of 100.4F (38C) or higher, or as directed by your healthcare provider   Passage of solid red or brown urine (can't  see through it) or urine with lots of blood clots   Foul-smelling or cloudy urine   Unable to pass urine for 8 hours and increasing bladder pressure  Date Last Reviewed: 10/16/2014   2000-2016 [employer] [address], [place] [address]. All rights reserved. This information is not intended as a substitute for professional medical care. Always follow your healthcare professional's instructions.

## 2015-07-14 NOTE — ED Provider Notes (Signed)
[facility]   EMERGENCY DEPARTMENT HISTORY AND PHYSICAL EXAM    Date: 07/14/2015  Patient Name: [facility]  Attending Physician:E Selinda Orion, MD  Patient DOB:  1934-12-04  MRN:  [id number]  Room:  ED3/ED3-A    Patient was evaluated by ED physician, Terrill Mohr, MD at 1:30 PM    History     Chief Complaint   Patient presents with   . Flank Pain   . Abdominal Pain   . Groin Pain       The patient Eddie Henry, is a 81 y.o. male who presents with chief complaint of sudden onset of pain in his left abdomin at 9:30 am this morning. Pt reports having nausea without vomiting. Pt states he woke up feeling fine around 6 am and has not had any recent illnesses. Pt state the pain feels similar to the kidney stone that passed on its own 3-4 years ago. Pt reports feeling fine here in the ER. The patient does not report any other pain or symptoms at this time.      Location  Left abdomin  Severity  moderate  Duration   intermittent  Radiation   Does not radiate  Character   sharp  Onset   sudden  Modifying  Associated Sx   Nausea    PCP:  Pcp, Noneorunknown, MD      Past Medical History       Past Medical History   Diagnosis Date   . Calculus of kidney    . Coronary artery disease          Past Surgical History       Past Surgical History   Procedure Laterality Date   . Coronary stent placement  2003         Family History    History reviewed. No pertinent family history.    Social History    Social History     Social History   . Marital Status: Married     Spouse Name: N/A   . Number of Children: N/A   . Years of Education: N/A     Social History Main Topics   . Smoking status: Never Smoker    . Smokeless tobacco: None   . Alcohol Use: None   . Drug Use: None   . Sexual Activity: Not Asked     Other Topics Concern   . None     Social History Narrative   . None       Allergies    Allergies   Allergen Reactions   . Sulfa Antibiotics          Current/Home Medications    Discharge Medication List as of 07/14/2015  1:48 PM       CONTINUE these medications which have NOT CHANGED    Details   aspirin 325 MG tablet Take 325 mg by mouth daily., Until Discontinued, Historical Med             Vital Signs     BP 147/68 mmHg  Pulse 51  Temp(Src) 97.6 F (36.4 C) (Tympanic)  Resp 18  Ht 1.676 m  Wt 72.576 kg  BMI 25.84 kg/m2  SpO2 98%  Patient Vitals for the past 24 hrs:   BP Temp Temp src Pulse Resp SpO2 Height Weight   07/14/15 1255 147/68 mmHg 97.6 F (36.4 C) Tympanic (!) 51 18 98 % 1.676 m 72.576 kg         Review of Systems  GENERAL: no fevers or chills or malaise    Head: no headache    Cardiovascular: No chest pain. No palpitations. No DOE. No orthopnea    Respiratory: No cough.  No shortness of breath.    GU:  No dysuria. No flank pain    GI: + nausea.  No vomiting, no diarrhea.  + left sided abdominal pain    Neurological:  No headache. No acute focal motor or sensory complaints     Musculoskeletal:  No edema. No cyanosis. No symptoms of DVT    Skin:  No rash.  No skin lesions.    Endocrine:  No polydipsia or polyuria     Psychiatric:  No depression.  No anxiety.      All other systems reviewed and negative except as above, pertinent findings in HPI.     Physical Exam     CONSTITUTIONAL  Patient is afebrile, Vital signs reviewed, Well appearing,  Patient appears comfortable, Alert and oriented X 3  HEAD   Atraumatic, Normocephalic.  EYES   Pupils equal, round and reactive to light, Extraocular muscles intact.   NECK   FROM  RESPIRATORY CHEST   Chest is nontender, Breath sounds normal, No respiratory distress.  CARDIOVASCULAR   RRR, No murmur, gallop, rub or JVD  ABDOMEN   Abdomen is nontender  MUSCULOSKELETAL:   UPPER EXTREMITY   Inspection normal No edema. ROM normal; No evidence of VTE  LOWER EXTREMITY   Inspection normal. No edema. ROM normal. No evidence of VTE  NEURO Normal motor, Normal sensory, CNs intact, Speech normal. Vision normal. Mentation normal  SKIN   Skin is warm, Skin is dry, Skin is normal color. No  rashes  PSYCHIATRIC   Normal affect,  Normal insight, Normal concentration.      ED Medication Orders     ED Medication Orders     None          Orders Placed During this Encounter     Orders Placed This Encounter   Procedures   . Urinalysis w Microscopic and Culture if Indicated       Diagnostic Study Results     Labs     Results     Procedure Component Value Units Date/Time    Urinalysis w Microscopic and Culture if Indicated [[id number]]  (Abnormal) Collected:  07/14/15 1315    Specimen Information:  Urine, Random Updated:  07/14/15 1336     Color, UA Yellow      Clarity, UA Clear      Specific Gravity, UR 1.020      pH, Urine 6.0 pH      Protein, UR Negative mg/dL      Glucose, UA Negative mg/dL      Ketones UA Negative mg/dL      Bilirubin, UA Negative mg/dL      Blood, UA 3+ (A) mg/dL      Nitrite, UA Negative      Urobilinogen, UA 0.2 mg/dL      Leukocyte Esterase, UA Negative Leu/uL      UR Micro Performed      RBC, UA 20-30 (A) /hpf      Bacteria, UA Few (A) /hpf           Radiologic Studies  Radiology Results (24 Hour)     ** No results found for the last 24 hours. **      .    Clinical Course        MDM  Notes:   1:45 PM   Pain free. No need for CT scanning..    Consults:       Data Review     Nursing records reviewed and agree: Yes    Pulse Oximetry Analysis - Normal  Laboratory results reviewed by EDP: No    Radiologic study results reviewed by EDP: No      Rendering Provider: Charissa Bash, MD      Monitors, EKG     Cardiac Monitor (interpreted by ED physician):      EKG (interpreted by ED physician):       Critical Care     Critical care exclusive of time spent performing procedures.    Total time:          Clinical Impression & Disposition     Clinical Impression:  1. Ureterolithiasis        Disposition  ED Disposition     Discharge [facility] discharge to home/self care.    Condition at disposition: Stable            Prescriptions    Discharge Medication List as of 07/14/2015  1:48 PM       START taking these medications    Details   oxyCODONE-acetaminophen (PERCOCET) 5-325 MG per tablet Take 1-2 tablets by mouth every 4 (four) hours as needed for Pain., Starting 07/14/2015, Until Discontinued, Print             The documentation recorded by my scribe, Pollyann Glen, accurately reflects the services I personally performed and the decisions made by me.    Maia Breslow, MD  07/15/2015 2:14 AM           Charissa Bash, MD  07/15/15 [phone]

## 2015-08-24 ENCOUNTER — Encounter: Payer: Self-pay | Admitting: Cardiovascular Disease

## 2015-08-24 ENCOUNTER — Encounter (INDEPENDENT_AMBULATORY_CARE_PROVIDER_SITE_OTHER): Payer: Self-pay

## 2015-08-24 ENCOUNTER — Ambulatory Visit (INDEPENDENT_AMBULATORY_CARE_PROVIDER_SITE_OTHER): Payer: Medicare Other | Admitting: Cardiovascular Disease

## 2015-08-24 VITALS — BP 128/64 | HR 56 | Ht 66.0 in | Wt 158.0 lb

## 2015-08-24 DIAGNOSIS — E785 Hyperlipidemia, unspecified: Secondary | ICD-10-CM

## 2015-08-24 DIAGNOSIS — I2510 Atherosclerotic heart disease of native coronary artery without angina pectoris: Secondary | ICD-10-CM | POA: Diagnosis not present

## 2015-08-24 NOTE — Progress Notes (Signed)
Cardiology Office Note    Date:  08/25/2015   ID:  Manuel Mcdowell, DOB 1934/10/20, MRN [id number]  PCP:  Manuel Rabon, MD  Cardiologist:   Manuel Fair, MD   chief complaint: CAD s/p CABG   History of Present Illness:  Manuel Mcdowell is a 82 y.o. male who is now 14 years status post LAD stent without recurrent coronary events despite her inability to treat his severe hyperlipidemia with pharmaceuticals (statins cause myalgia and abnormal transaminases, ezetimibe caused myalgia, niacin was poorly tolerated due to flushing, unable to afford PCS K9 inhibitors). He remains very physically active exercises and works in his yard daily without complaints of angina or dyspnea. He also denies palpitations, syncope, focal neurological events, intermittent claudication, leg edema or other cardiovascular problems.  He takes aspirin. He does not receive beta blockers due to resting bradycardia.  Past Medical History:  Diagnosis Date  . Arrhythmia   . CAD (coronary artery disease) s/p LAD stent 2003 12/06/2012   LAD stent (bare metal BXVelocity 2.5x18), 2003  . CHF (congestive heart failure) (HCC)   . Heart disease   . High cholesterol   . Hypercholesterolemia    severe  . Hyperlipidemia 12/06/2012   Statin intolerant - myalgia and LFT elevation, Zetia intolerant -myalgia, Niacin intolerant  . Liver disease   . Myocardial infarction (HCC)   . Visual disorder     Past Surgical History:  Procedure Laterality Date  . CARDIAC CATHETERIZATION     stent place in his LAD artery  . ELECTROCARDIOGRAM     normal  . exercise myocardiolite    . Stress Dipyridamole myocardial perfusion  2010    Current Medications: Outpatient Medications Prior to Visit  Medication Sig Dispense Refill  . aspirin 325 MG tablet Take 325 mg by mouth daily.    Marland Kitchen MILK THISTLE PO Take 600 mg by mouth 2 (two) times daily.    . Probiotic Product (PROBIOTIC DAILY PO) Take by mouth daily.    . Red Yeast Rice 600 MG  CAPS Take 2 capsules by mouth daily.    . cholecalciferol (VITAMIN D) 400 UNITS TABS tablet Take 400 Units by mouth daily.    Marland Kitchen CINNAMON PO Take by mouth daily.    . Flaxseed, Linseed, (FLAX SEED OIL PO) Take 2 tablets by mouth daily.     No facility-administered medications prior to visit.      Allergies:   Statins and Sulfa antibiotics   Social History   Social History  . Marital status: Married    Spouse name: N/A  . Number of children: N/A  . Years of education: N/A   Social History Main Topics  . Smoking status: Never Smoker  . Smokeless tobacco: Never Used  . Alcohol use Yes     Comment: occas. wine  . Drug use: No  . Sexual activity: Not Asked   Other Topics Concern  . None   Social History Narrative  . None     Family History:  The patient's family history includes Stroke in his father and mother.   ROS:   Please see the history of present illness.    ROS All other systems reviewed and are negative.   PHYSICAL EXAM:   VS:  BP 128/64   Pulse (!) 56   Ht  (1.676 m)   Wt 158 lb (71.7 kg)   BMI 25.50 kg/m    GEN: Well nourished, well developed, in no acute distress  HEENT: normal  Neck:  no JVD, carotid bruits, or masses Cardiac: RRR; no murmurs, rubs, or gallops,no edema  Respiratory:  clear to auscultation bilaterally, normal work of breathing GI: soft, nontender, nondistended, + BS MS: no deformity or atrophy  Skin: warm and dry, no rash Neuro:  Alert and Oriented x 3, Strength and sensation are intact Psych: euthymic mood, full affect  Wt Readings from Last 3 Encounters:  08/24/15 158 lb (71.7 kg)  03/11/14 166 lb 8 oz (75.5 kg)  12/02/12 157 lb 9.6 oz (71.5 kg)      Studies/Labs Reviewed:   EKG:  EKG is ordered today.  The ekg ordered today demonstrates Sinus bradycardia, otherwise normal  Chol 250, LDL 178, HDL 47 TG 122  ASSESSMENT:    1. Coronary artery disease involving native coronary artery of native heart without angina  pectoris   2. Hyperlipidemia      PLAN:  In order of problems listed above:  1. Manuel Mcdowell remains asymptomatic from a coronary point of view despite an active lifestyle.  2. Unfortunately we have not been able to address his fairly severe hyperlipidemia except with red yeast rice (which he seems to tolerate as long as he takes coenzyme every 10.). Discussed the fact that we are recruiting patients for clinical trial of bempedoic acid. He would like to think about whether or not he wants to participate in such a trial. Encouraged him to remain active. Congratulated him on losing a little bit of weight. He is almost at ideal body weight.     Medication Adjustments/Labs and Tests Ordered: Current medicines are reviewed at length with the patient today.  Concerns regarding medicines are outlined above.  Medication changes, Labs and Tests ordered today are listed in the Patient Instructions below. Patient Instructions  Dr Manuel Mcdowell recommends that you schedule a follow-up appointment in 12 months. You will receive a reminder letter in the mail two months in advance. If you don't receive a letter, please call our office to schedule the follow-up appointment.  If you need a refill on your cardiac medications before your next appointment, please call your pharmacy.    Signed, Manuel FairMihai Mira Balon, MD  08/25/2015 5:47 PM    [facility] [address], [place], [place]  [address] Phone: [phone]; Fax: [phone]) [phone]

## 2015-08-24 NOTE — Patient Instructions (Signed)
Dr Croitoru recommends that you schedule a follow-up appointment in 12 months. You will receive a reminder letter in the mail two months in advance. If you don't receive a letter, please call our office to schedule the follow-up appointment.  If you need a refill on your cardiac medications before your next appointment, please call your pharmacy. 

## 2016-09-21 ENCOUNTER — Encounter: Payer: Self-pay | Admitting: Cardiovascular Disease

## 2016-09-21 ENCOUNTER — Ambulatory Visit: Payer: Medicare Other | Admitting: Cardiovascular Disease

## 2016-09-21 ENCOUNTER — Ambulatory Visit (INDEPENDENT_AMBULATORY_CARE_PROVIDER_SITE_OTHER): Payer: Medicare Other | Admitting: Cardiovascular Disease

## 2016-09-21 VITALS — BP 120/70 | HR 53 | Ht 66.0 in | Wt 152.0 lb

## 2016-09-21 DIAGNOSIS — I2510 Atherosclerotic heart disease of native coronary artery without angina pectoris: Secondary | ICD-10-CM | POA: Diagnosis not present

## 2016-09-21 DIAGNOSIS — E7800 Pure hypercholesterolemia, unspecified: Secondary | ICD-10-CM | POA: Diagnosis not present

## 2016-09-21 NOTE — Progress Notes (Signed)
Cardiology Office Note    Date:  09/23/2016   ID:  Manuel Mcdowell, DOB 01-26-1934, MRN [id number]  PCP:  Romeo Rabon, MD  Cardiologist:   Thurmon Fair, MD   chief complaint: CAD s/p CABG   History of Present Illness:  Manuel Mcdowell is a 82 y.o. male who is now 14 years status post LAD stent without recurrent coronary events. He has significant hypercholesterolemia but to date we have not been able to find adequate treatment for this. (statins cause myalgia and abnormal transaminases, ezetimibe caused myalgia, niacin was poorly tolerated due to flushing, unable to afford PCS K9 inhibitors).   Cholesterol remains high and Dr. Oscar Mcdowell has recommended treatment with Repatha.  He remains very physically active exercises and works in his yard daily . He has not had exertional angina or dyspnea. He also denies palpitations, syncope, focal neurological events, intermittent claudication, leg edema or other cardiovascular problems.  He takes aspirin. He does not receive beta blockers due to resting bradycardia.  Past Medical History:  Diagnosis Date  . Arrhythmia   . CAD (coronary artery disease) s/p LAD stent 2003 12/06/2012   LAD stent (bare metal BXVelocity 2.5x18), 2003  . CHF (congestive heart failure) (HCC)   . Heart disease   . High cholesterol   . Hypercholesterolemia    severe  . Hyperlipidemia 12/06/2012   Statin intolerant - myalgia and LFT elevation, Zetia intolerant -myalgia, Niacin intolerant  . Liver disease   . Myocardial infarction (HCC)   . Visual disorder     Past Surgical History:  Procedure Laterality Date  . CARDIAC CATHETERIZATION     stent place in his LAD artery  . ELECTROCARDIOGRAM     normal  . exercise myocardiolite    . Stress Dipyridamole myocardial perfusion  2010    Current Medications: Outpatient Medications Prior to Visit  Medication Sig Dispense Refill  . aspirin 325 MG tablet Take 325 mg by mouth daily.    Marland Kitchen MILK THISTLE PO Take 600  mg by mouth 2 (two) times daily.    . Probiotic Product (PROBIOTIC DAILY PO) Take by mouth daily.    . Red Yeast Rice 600 MG CAPS Take 2 capsules by mouth daily.     No facility-administered medications prior to visit.      Allergies:   Statins and Sulfa antibiotics   Social History   Social History  . Marital status: Married    Spouse name: N/A  . Number of children: N/A  . Years of education: N/A   Social History Main Topics  . Smoking status: Never Smoker  . Smokeless tobacco: Never Used  . Alcohol use Yes     Comment: occas. wine  . Drug use: No  . Sexual activity: Not Asked   Other Topics Concern  . None   Social History Narrative  . None     Family History:  The patient's family history includes Stroke in his father and mother.   ROS:   Please see the history of present illness.    ROS All other systems reviewed and are negative.   PHYSICAL EXAM:   VS:  BP 120/70   Pulse (!) 53   Ht  (1.676 m)   Wt 152 lb (68.9 kg)   BMI 24.53 kg/m     General: Alert, oriented x3, no distress. Appears thin and fit. Head: no evidence of trauma, PERRL, EOMI, no exophtalmos or lid lag, no myxedema, no xanthelasma; normal ears, nose and  oropharynx Neck: normal jugular venous pulsations and no hepatojugular reflux; brisk carotid pulses without delay and no carotid bruits Chest: clear to auscultation, no signs of consolidation by percussion or palpation, normal fremitus, symmetrical and full respiratory excursions Cardiovascular: normal position and quality of the apical impulse, regular rhythm, normal first and second heart sounds, no murmurs, rubs or gallops Abdomen: no tenderness or distention, no masses by palpation, no abnormal pulsatility or arterial bruits, normal bowel sounds, no hepatosplenomegaly Extremities: no clubbing, cyanosis or edema; 2+ radial, ulnar and brachial pulses bilaterally; 2+ right femoral, posterior tibial and dorsalis pedis pulses; 2+ left  femoral, posterior tibial and dorsalis pedis pulses; no subclavian or femoral bruits Neurological: grossly nonfocal Psych: euthymic mood, full affect  Wt Readings from Last 3 Encounters:  09/21/16 152 lb (68.9 kg)  08/24/15 158 lb (71.7 kg)  03/11/14 166 lb 8 oz (75.5 kg)      Studies/Labs Reviewed:   EKG:  EKG is ordered today.  The ekg ordered today demonstrates Sinus bradycardia, QTC 392ms  Chol 250, LDL 178, HDL 47 TG 122  ASSESSMENT:    1. Coronary artery disease involving native coronary artery of native heart without angina pectoris   2. Pure hypercholesterolemia      PLAN:  In order of problems listed above:  1. CAD: Asymptomatic, no requirement for antianginal therapy.  2. HLP: I encouraged Manuel Mcdowell to call Dr. Aaron Edelmanaplan's advice regarding use of PCS K9 inhibitor. If they have trouble obtaining coverage for this medication, our office will be glad to try to help.     Medication Adjustments/Labs and Tests Ordered: Current medicines are reviewed at length with the patient today.  Concerns regarding medicines are outlined above.  Medication changes, Labs and Tests ordered today are listed in the Patient Instructions below. Patient Instructions  Dr Manuel Mcdowell recommends that you schedule a follow-up appointment in 12 months. You will receive a reminder letter in the mail two months in advance. If you don't receive a letter, please call our office to schedule the follow-up appointment.  If you need a refill on your cardiac medications before your next appointment, please call your pharmacy.    Signed, Thurmon FairMihai Diandre Merica, MD  09/23/2016 11:25 AM    [facility] [address], [place], [place]  [address] Phone: [phone]; Fax: [phone]

## 2016-09-21 NOTE — Patient Instructions (Signed)
Dr Croitoru recommends that you schedule a follow-up appointment in 12 months. You will receive a reminder letter in the mail two months in advance. If you don't receive a letter, please call our office to schedule the follow-up appointment.  If you need a refill on your cardiac medications before your next appointment, please call your pharmacy. 

## 2016-12-03 ENCOUNTER — Ambulatory Visit: Payer: Medicare Other | Admitting: Cardiovascular Disease

## 2017-05-09 ENCOUNTER — Telehealth: Payer: Self-pay

## 2017-05-09 NOTE — Telephone Encounter (Signed)
    [facility] Pre-operative Risk Assessment    Request for surgical clearance:  1. What type of surgery is being performed? Needs extraction # 2  2. When is this surgery scheduled? TBD  3. What type of clearance is required (medical clearance vs. Pharmacy clearance to hold med vs. Both)? Both ? antibiotic prophylaxis  4. Are there any medications that need to be held prior to surgery and how long? Aspirin  5. Practice name and name of physician performing surgery? [facility]. No Dr.listed  6. What is your office phone number [phone]   7.   What is your office fax number              [phone]  8.   Anesthesia type (None, local, MAC, general) ? Epinephrine   Thedore Mins Pugh 05/09/2017, 2:09 PM  _________________________________________________________________   (provider comments below)

## 2017-05-10 NOTE — Telephone Encounter (Signed)
Patient with remote PCI of LAD more than 14 years ago. No prior h/o valvular issue, no SBE prophylaxis needed. Will forward to clinical pharmacist to double check. Ideally need to proceed with tooth extraction on aspirin if possible, but if absolutely need to come off the aspirin, may hold aspirin for 5 days prior to the procedure and restart as soon as possible afterward.

## 2017-05-13 NOTE — Telephone Encounter (Signed)
Routing to the dentist.  Shamari Lofquist C. Rayson Rando, RN, ANP-C [facility] [address] [place], [place]  [address] [phone]  

## 2017-05-13 NOTE — Telephone Encounter (Signed)
Pt will not require SBE prophylaxis.

## 2017-09-24 ENCOUNTER — Ambulatory Visit (INDEPENDENT_AMBULATORY_CARE_PROVIDER_SITE_OTHER): Payer: Medicare Other | Admitting: Cardiovascular Disease

## 2017-09-24 ENCOUNTER — Encounter: Payer: Self-pay | Admitting: Cardiovascular Disease

## 2017-09-24 VITALS — BP 122/64 | HR 53 | Ht 66.0 in | Wt 138.6 lb

## 2017-09-24 DIAGNOSIS — I2510 Atherosclerotic heart disease of native coronary artery without angina pectoris: Secondary | ICD-10-CM

## 2017-09-24 DIAGNOSIS — E7800 Pure hypercholesterolemia, unspecified: Secondary | ICD-10-CM

## 2017-09-24 MED ORDER — ASPIRIN 81 MG PO TABS: 81.0000 mg | ORAL_TABLET | Freq: Every day | ORAL | Status: DC

## 2017-09-24 NOTE — Patient Instructions (Addendum)
Dr Royann Shivers has recommended making the following medication changes: 1. DECREASE Aspirin to 81 mg daily  Your physician recommends that you schedule a follow-up appointment in 12 months. You will receive a reminder letter in the mail two months in advance. If you don't receive a letter, please call our office to schedule the follow-up appointment.  If you need a refill on your cardiac medications before your next appointment, please call your pharmacy.
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
## 2018-03-19 ENCOUNTER — Encounter: Payer: Self-pay | Admitting: Nurse Practitioner

## 2018-03-19 ENCOUNTER — Other Ambulatory Visit: Payer: Self-pay

## 2018-03-19 ENCOUNTER — Ambulatory Visit (INDEPENDENT_AMBULATORY_CARE_PROVIDER_SITE_OTHER): Payer: Medicare Other | Admitting: Nurse Practitioner

## 2018-03-19 DIAGNOSIS — R197 Diarrhea, unspecified: Secondary | ICD-10-CM | POA: Diagnosis not present

## 2018-03-19 DIAGNOSIS — R195 Other fecal abnormalities: Secondary | ICD-10-CM

## 2018-03-19 DIAGNOSIS — R634 Abnormal weight loss: Secondary | ICD-10-CM

## 2018-03-19 DIAGNOSIS — K529 Noninfective gastroenteritis and colitis, unspecified: Secondary | ICD-10-CM

## 2018-03-19 MED ORDER — NA SULFATE-K SULFATE-MG SULF 17.5-3.13-1.6 GM/177ML PO SOLN: 1.0000 | ORAL | 0 refills | Status: DC

## 2018-03-19 NOTE — Progress Notes (Signed)
Primary Care Physician:  Romeo Rabon, MD Primary Gastroenterologist:  Dr. Jena Gauss  Chief Complaint  Patient presents with  . Consult    TCS. Last had done 2006  . Diarrhea    3 times a day, watery/loose, had recent positive stool test    HPI:   Manuel Mcdowell is a 84 y.o. male who presents on referral from primary care for diarrhea and weight loss.  Reviewed information provided with referral including office visit dated 11/04/2018 for weight loss/diarrhea as well as 10-month follow-up for other conditions.  At that time he complained of bilateral lower abdominal crampy pains following most meals and associated brown watery stools for 30 minutes to 1 hour.  Pain does improve somewhat after a bowel movement.  Symptoms started 2 to 3 weeks prior and subjective weight loss of 18 to 20 pounds.  No known definite triggers.  He was previously on MiraLAX but stopped this with diarrhea.  Previous colonoscopy in 2006 with Dr. Samuella Cota which was normal.  Recommended labs and CT follow-up.  No lab results or colonoscopy result were included with his records.  No history of colonoscopy in our system.  Today he states his last colonoscopy was in 2006.  He states he did a stool test which was positive for blood.  He was started on Cipro and Flagyl for CT showing thickened colon.  He did not tolerate the Flagyl and was subsequently changed to Augmentin which he is still taking. Diarrhea started about 1.5 months ago. Still having diarrhea. Feels like it's improving, still on antibiotics. He had a stool test which was + for heme. Denies obvious hematochezia, melena. Weight loss has not been ongoing since his diarrhea, more long-term. Was having lower abdominal pain which has significantly improved on antibiotics.. Denies chest pain, dyspnea, dizziness, lightheadedness, syncope, near syncope. Denies any other upper or lower GI symptoms.  Past Medical History:  Diagnosis Date  . Arrhythmia   . CAD (coronary  artery disease) s/p LAD stent 2003 12/06/2012   LAD stent (bare metal BXVelocity 2.5x18), 2003  . CHF (congestive heart failure) (HCC)   . Heart disease   . High cholesterol   . Hypercholesterolemia    severe  . Hyperlipidemia 12/06/2012   Statin intolerant - myalgia and LFT elevation, Zetia intolerant -myalgia, Niacin intolerant  . Liver disease   . Myocardial infarction (HCC)   . Visual disorder     Past Surgical History:  Procedure Laterality Date  . CARDIAC CATHETERIZATION     stent place in his LAD artery  . ELECTROCARDIOGRAM     normal  . exercise myocardiolite    . Stress Dipyridamole myocardial perfusion  2010    Current Outpatient Medications  Medication Sig Dispense Refill  . amoxicillin-clavulanate (AUGMENTIN) 875-125 MG tablet Take 1 tablet by mouth 2 (two) times daily.    . Ascorbic Acid (VITAMIN C) 1000 MG tablet Take 1,000 mg by mouth daily.    Marland Kitchen aspirin 81 MG tablet Take 1 tablet (81 mg total) by mouth daily.    . Cholecalciferol (VITAMIN D-3) 125 MCG (5000 UT) TABS Take by mouth daily.    . Cinnamon 500 MG capsule Take 1,000 mg by mouth daily.    . Coenzyme Q10 (COQ10) 100 MG CAPS Take by mouth daily.    . Flaxseed, Linseed, (FLAXSEED OIL MAX STR) 1300 MG CAPS Take by mouth daily.    Marland Kitchen MILK THISTLE PO Take 600 mg by mouth 2 (two) times daily.    Marland Kitchen  Multiple Vitamins-Minerals (ZINC PO) Take by mouth daily.    . Probiotic Product (PROBIOTIC DAILY PO) Take by mouth daily.    . Red Yeast Rice 600 MG CAPS Take 2 capsules by mouth daily.     No current facility-administered medications for this visit.     Allergies as of 03/19/2018 - Review Complete 03/19/2018  Allergen Reaction Noted  . Statins Anaphylaxis 12/02/2012  . Metronidazole  03/19/2018  . Sulfa antibiotics Hives 12/02/2012    Family History  Problem Relation Age of Onset  . Stroke Mother   . Stroke Father   . Colon cancer Neg Hx     Social History   Socioeconomic History  . Marital  status: Married    Spouse name: Not on file  . Number of children: Not on file  . Years of education: Not on file  . Highest education level: Not on file  Occupational History  . Not on file  Social Needs  . Financial resource strain: Not on file  . Food insecurity:    Worry: Not on file    Inability: Not on file  . Transportation needs:    Medical: Not on file    Non-medical: Not on file  Tobacco Use  . Smoking status: Never Smoker  . Smokeless tobacco: Never Used  Substance and Sexual Activity  . Alcohol use: Yes    Comment: occas. wine  . Drug use: No  . Sexual activity: Not on file  Lifestyle  . Physical activity:    Days per week: Not on file    Minutes per session: Not on file  . Stress: Not on file  Relationships  . Social connections:    Talks on phone: Not on file    Gets together: Not on file    Attends religious service: Not on file    Active member of club or organization: Not on file    Attends meetings of clubs or organizations: Not on file    Relationship status: Not on file  . Intimate partner violence:    Fear of current or ex partner: Not on file    Emotionally abused: Not on file    Physically abused: Not on file    Forced sexual activity: Not on file  Other Topics Concern  . Not on file  Social History Narrative  . Not on file    Review of Systems: General: Negative for anorexia, weight loss, fever, chills, fatigue, weakness. Eyes: Negative for vision changes.  ENT: Negative for hoarseness, difficulty swallowing. CV: Negative for chest pain, angina, palpitations, peripheral edema.  Respiratory: Negative for dyspnea at rest, cough, sputum, wheezing.  GI: See history of present illness. MS: Negative for joint pain, low back pain.  Derm: Negative for rash or itching.  Endo: Negative for unusual weight change.  Heme: Negative for bruising or bleeding. Allergy: Negative for rash or hives.    Physical Exam: BP 110/63   Pulse 64   Temp (!)  97 F (36.1 C) (Oral)   Ht 5\' 6"  (1.676 m)   Wt 134 lb 3.2 oz (60.9 kg)   BMI 21.66 kg/m  General:   Alert and oriented. Pleasant and cooperative. Well-nourished and well-developed.  Head:  Normocephalic and atraumatic. Eyes:  Without icterus, sclera clear and conjunctiva pink.  Ears:  Normal auditory acuity. Cardiovascular:  S1, S2 present without murmurs appreciated. Extremities without clubbing or edema. Respiratory:  Clear to auscultation bilaterally. No wheezes, rales, or rhonchi. No distress.  Gastrointestinal:  +  BS, soft, non-tender and non-distended. No HSM noted. No guarding or rebound. No masses appreciated.  Rectal:  Deferred  Musculoskalatal:  Symmetrical without gross deformities. Skin:  Intact without significant lesions or rashes. Neurologic:  Alert and oriented x4;  grossly normal neurologically. Psych:  Alert and cooperative. Normal mood and affect. Heme/Lymph/Immune: No excessive bruising noted.    03/19/2018 2:09 PM   Disclaimer: This note was dictated with voice recognition software. Similar sounding words can inadvertently be transcribed and may not be corrected upon review.

## 2018-03-19 NOTE — Assessment & Plan Note (Signed)
The patient began having diarrhea and abdominal pain.  Per the patient, CT showed thickening of the wall of his colon and he was started on antibiotics.  This sounds like presumed infectious colitis.  Last colonoscopy 2006.  He is still having some diarrhea but much improved.  Abdominal pain is also significantly improved.  We will plan to repeat colonoscopy in 6 to 8 weeks to further evaluate.  Recommend he finish his antibiotics and continue probiotics.  Follow-up in 4 months.  We will request CT scan results and lab results from primary care.

## 2018-03-19 NOTE — Assessment & Plan Note (Signed)
Presumed infectious diarrhea due to likely infectious colitis.  He is finishing up his antibiotics and has about 3 days left.  His abdominal pain, diarrhea has significantly improved.  Recommend he continue probiotics.  He is considering changing to a different probiotic agent because he has been on this current one for a number of years.  We will provide samples for him.  We will request CT results and lab results from primary care.  Follow-up in 4 months.  Colonoscopy as per above.

## 2018-03-19 NOTE — Assessment & Plan Note (Signed)
The patient states he did a stool test at his primary care which was positive for blood.  He has not noticed obvious hematochezia or melena.  Given this, diagnosis of colitis with (per the patient) thickened colon wall, diarrhea, weight loss we will plan for colonoscopy.  Last colonoscopy was 2006 and essentially normal.  Return for follow-up in 4 months.  Proceed with TCS in 6 to 8 weeks with Dr. Jena Gauss in near future: the risks, benefits, and alternatives have been discussed with the patient in detail. The patient states understanding and desires to proceed.  The patient is not on any anticoagulants, anxiolytics, chronic pain medications, or antidepressants.  Conscious sedation should be adequate for his procedure.

## 2018-03-19 NOTE — Assessment & Plan Note (Signed)
After discussion with the patient and reading primary care notes the patient has had about 20 pound weight loss but it seems to be more remote in the past 8 months.  Stable weight during his diarrheal illness presumed infectious colitis.  She further recommendations below.  Follow-up in 4 months.  Monitor weight.

## 2018-03-19 NOTE — Patient Instructions (Signed)
Your health issues we discussed today were:   Abdominal pain and diarrhea: 1. As we discussed, this is most likely due to a colon infection and inflammation ("colitis"). 2. This is supported by the idea that you are improving on antibiotics 3. Finish your antibiotics 4. Continue taking probiotics.  I am giving you coupons for another probiotic should you decide to try a different option 5. We will call your primary care and request CT results and lab results  Weight loss: 1. As we discussed, it seems the majority of your weight loss was before your illness 2. Keep an eye on your weight and let us know if you have any persistent ongoing weight loss 3. Return for follow-up in 4 months  Positive test for blood in stool: 1. We will request this result from your primary care doctor 2. Because of the blood in your stool, abdominal pain, diarrhea, and abnormal CT we will proceed with a colonoscopy 3. Further recommendations will be made after your colonoscopy  Overall I recommend:  1. Return for follow-up in 4 months 2. Call us if you have any questions or concerns   At [facility] we value your feedback. You may receive a survey about your visit today. Please share your experience as we strive to create trusting relationships with our patients to provide genuine, compassionate, quality care.  We appreciate your understanding and patience as we review any laboratory studies, imaging, and other diagnostic tests that are ordered as we care for you. Our office policy is 5 business days for review of these results, and any emergent or urgent results are addressed in a timely manner for your best interest. If you do not hear from our office in 1 week, please contact us.   We also encourage the use of MyChart, which contains your medical information for your review as well. If you are not enrolled in this feature, an access code is on this after visit summary for your convenience.  Thank you for allowing [place] to be involved in your care.  It was great to see you today!  I hope you have a great day!!

## 2018-03-20 NOTE — Progress Notes (Signed)
cc'd to pcp 

## 2018-05-01 ENCOUNTER — Telehealth: Payer: Self-pay | Admitting: Internal Medicine

## 2018-05-01 NOTE — Telephone Encounter (Signed)
Pt's wife called and wanted to cancel patient's procedure with [facility] for 05/16/2018 due to covid-19. He doesn't want to reschedule right now, but wants to keep the OV in July.

## 2018-05-01 NOTE — Telephone Encounter (Signed)
Called pt's wife, confirmed he wants to cancel procedure and doesn't want to reschedule. Endo scheduler informed to cancel procedure.  FYI to EG.

## 2018-05-01 NOTE — Telephone Encounter (Signed)
Noted  

## 2018-05-16 ENCOUNTER — Ambulatory Visit (HOSPITAL_COMMUNITY): Admit: 2018-05-16 | Payer: Medicare Other | Admitting: Internal Medicine

## 2018-05-16 ENCOUNTER — Encounter (HOSPITAL_COMMUNITY): Payer: Self-pay

## 2018-05-16 SURGERY — COLONOSCOPY
Anesthesia: Moderate Sedation

## 2018-07-23 ENCOUNTER — Ambulatory Visit: Payer: Medicare Other | Admitting: Nurse Practitioner

## 2018-08-25 ENCOUNTER — Other Ambulatory Visit: Payer: Self-pay

## 2018-08-25 ENCOUNTER — Encounter: Payer: Self-pay | Admitting: Nurse Practitioner

## 2018-08-25 ENCOUNTER — Ambulatory Visit (INDEPENDENT_AMBULATORY_CARE_PROVIDER_SITE_OTHER): Payer: Medicare Other | Admitting: Nurse Practitioner

## 2018-08-25 VITALS — BP 110/65 | HR 71 | Temp 96.9°F | Ht 66.0 in | Wt 133.8 lb

## 2018-08-25 DIAGNOSIS — R197 Diarrhea, unspecified: Secondary | ICD-10-CM | POA: Diagnosis not present

## 2018-08-25 DIAGNOSIS — R935 Abnormal findings on diagnostic imaging of other abdominal regions, including retroperitoneum: Secondary | ICD-10-CM

## 2018-08-25 DIAGNOSIS — K529 Noninfective gastroenteritis and colitis, unspecified: Secondary | ICD-10-CM

## 2018-08-25 MED ORDER — PEG 3350-KCL-NA BICARB-NACL 420 G PO SOLR: 4000.0000 mL | ORAL | 0 refills | Status: DC

## 2018-08-25 NOTE — Assessment & Plan Note (Signed)
Previous significant diarrhea associated with colitis.  Recommended colonoscopy which the patient had to cancel and is now wanting to reschedule.  He did have one episode of diarrhea last week but this was self-limiting and likely viral gastroenteritis.  No further diarrhea at this time.  Continue current medications and follow-up in 4 months.  Call for any worsening or recurrent diarrhea.

## 2018-08-25 NOTE — Patient Instructions (Signed)
Your health issues we discussed today were:   Previous colitis with abdominal pain, diarrhea, abnormal CT scan: 1. Advised you are doing better! 2. There is no further need for antibiotics at this time 3. As we discussed, given your CT report and previous symptoms, we will reschedule your colonoscopy 4. Further recommendations will be made after your colonoscopy 5. Call us if you have any recurrent or worsening symptoms  Overall I recommend:  1. Continue your other current medications 2. Follow-up in 4 months 3. Call us if you have any questions or concerns.   Because of recent events of COVID-19 ("Coronavirus"), follow CDC recommendations:  1. Wash your hand frequently 2. Avoid touching your face 3. Stay away from people who are sick 4. If you have symptoms such as fever, cough, shortness of breath then call your healthcare provider for further guidance 5. If you are sick, STAY AT HOME unless otherwise directed by your healthcare provider. 6. Follow directions from state and national officials regarding staying safe    At [facility] we value your feedback. You may receive a survey about your visit today. Please share your experience as we strive to create trusting relationships with our patients to provide genuine, compassionate, quality care.  We appreciate your understanding and patience as we review any laboratory studies, imaging, and other diagnostic tests that are ordered as we care for you. Our office policy is 5 business days for review of these results, and any emergent or urgent results are addressed in a timely manner for your best interest. If you do not hear from our office in 1 week, please contact us.   We also encourage the use of MyChart, which contains your medical information for your review as well. If you are not enrolled in this feature, an access code is on this after visit summary for your convenience. Thank you for allowing [place] to be involved in  your care.  It was great to see you today!  I hope you have a great summer!!

## 2018-08-25 NOTE — Progress Notes (Signed)
Referring Provider: Romeo Rabonaplan, Michael, MD Primary Care Physician:  Romeo Rabonaplan, Michael, MD Primary GI:  Dr. Jena Gaussourk  Chief Complaint  Patient presents with  . colitis    had episode that started Friday, some diarhhea    HPI:   Manuel Mcdowell is a 84 y.o. male who presents for follow-up on colitis and abdominal pain.  Patient was last seen in our office 03/19/2018 for colitis, diarrhea, heme positive stool, weight loss.  At his last visit he was referred by primary care for weight loss and diarrhea as well as crampy abdominal pain after most meals that improved somewhat after bowel movement.  Previous colonoscopy 2006 which was essentially normal per the patient.  At his last visit he noted a stool test positive for blood, CT which showed thickened colon started on Cipro and Flagyl although he did not tolerate Flagyl and was subsequently changed to Augmentin.  Diarrhea for about the past 1-1/2 months although it feels like it is improving, still on antibiotics.  Denies obvious hematochezia or melena.  His weight loss is more long-term and not necessarily starting with his diarrhea.  Lower abdominal pain significantly improved on antibiotics.  No other GI complaints.  Recommended completing antibiotics, start probiotics, request CT and lab results, monitor weight, update colonoscopy.  His colonoscopy was scheduled for 05/16/2018.  However, a couple weeks prior his wife called to cancel due to COVID-19 and would discuss rescheduling at his next office visit.  Previous labs and CT imaging from primary care has not been received yet.  Today he states he's doing ok overall. He brings labs and CT results with him. Denies abdominal pain. He had diarrhea Thursday which resolved by Friday evening. Denies hematochezia, melena, fever, chills. States his wife told him he has been losing weight. However, he weight today is within a pound. Denies URI or flu-like symptoms. Denies loss of sense of taste or smell. Denies  chest pain, dyspnea, dizziness, lightheadedness, syncope, near syncope. Denies any other upper or lower GI symptoms.  Past Medical History:  Diagnosis Date  . Arrhythmia   . CAD (coronary artery disease) s/p LAD stent 2003 12/06/2012   LAD stent (bare metal BXVelocity 2.5x18), 2003  . CHF (congestive heart failure) (HCC)   . Heart disease   . High cholesterol   . Hypercholesterolemia    severe  . Hyperlipidemia 12/06/2012   Statin intolerant - myalgia and LFT elevation, Zetia intolerant -myalgia, Niacin intolerant  . Liver disease   . Myocardial infarction (HCC)   . Visual disorder     Past Surgical History:  Procedure Laterality Date  . CARDIAC CATHETERIZATION     stent place in his LAD artery  . ELECTROCARDIOGRAM     normal  . exercise myocardiolite    . Stress Dipyridamole myocardial perfusion  2010    Current Outpatient Medications  Medication Sig Dispense Refill  . Ascorbic Acid (VITAMIN C) 1000 MG tablet Take 1,000 mg by mouth daily.    Marland Kitchen. aspirin 81 MG tablet Take 1 tablet (81 mg total) by mouth daily.    . Cholecalciferol (VITAMIN D-3) 125 MCG (5000 UT) TABS Take by mouth daily.    . Cinnamon 500 MG capsule Take 1,000 mg by mouth daily.    . Coenzyme Q10 (COQ10) 100 MG CAPS Take by mouth daily.    . Flaxseed, Linseed, (FLAXSEED OIL MAX STR) 1300 MG CAPS Take by mouth daily.    Marland Kitchen. MILK THISTLE PO Take 600 mg by mouth 2 (  two) times daily.    . Multiple Vitamins-Minerals (ZINC PO) Take by mouth daily.    . Probiotic Product (PROBIOTIC DAILY PO) Take by mouth daily.    . Red Yeast Rice 600 MG CAPS Take 2 capsules by mouth daily.     No current facility-administered medications for this visit.     Allergies as of 08/25/2018 - Review Complete 08/25/2018  Allergen Reaction Noted  . Statins Anaphylaxis 12/02/2012  . Metronidazole  03/19/2018  . Sulfa antibiotics Hives 12/02/2012    Family History  Problem Relation Age of Onset  . Stroke Mother   . Stroke Father    . Colon cancer Neg Hx     Social History   Socioeconomic History  . Marital status: Married    Spouse name: Not on file  . Number of children: Not on file  . Years of education: Not on file  . Highest education level: Not on file  Occupational History  . Not on file  Social Needs  . Financial resource strain: Not on file  . Food insecurity    Worry: Not on file    Inability: Not on file  . Transportation needs    Medical: Not on file    Non-medical: Not on file  Tobacco Use  . Smoking status: Never Smoker  . Smokeless tobacco: Never Used  Substance and Sexual Activity  . Alcohol use: Yes    Comment: occas. wine  . Drug use: No  . Sexual activity: Not on file  Lifestyle  . Physical activity    Days per week: Not on file    Minutes per session: Not on file  . Stress: Not on file  Relationships  . Social Herbalist on phone: Not on file    Gets together: Not on file    Attends religious service: Not on file    Active member of club or organization: Not on file    Attends meetings of clubs or organizations: Not on file    Relationship status: Not on file  Other Topics Concern  . Not on file  Social History Narrative  . Not on file    Review of Systems: General: Negative for anorexia, weight loss, fever, chills, fatigue, weakness. ENT: Negative for hoarseness, difficulty swallowing. CV: Negative for chest pain, angina, palpitations, peripheral edema.  Respiratory: Negative for dyspnea at rest, cough, sputum, wheezing.  GI: See history of present illness. Endo: Negative for unusual weight change.  Heme: Negative for bruising or bleeding. Allergy: Negative for rash or hives.   Physical Exam: BP 110/65   Pulse 71   Temp (!) 96.9 F (36.1 C) (Oral)   Ht 5\' 6"  (1.676 m)   Wt 133 lb 12.8 oz (60.7 kg)   BMI 21.60 kg/m  General:   Alert and oriented. Pleasant and cooperative. Well-nourished and well-developed.  Eyes:  Without icterus, sclera clear  and conjunctiva pink.  Ears:  Normal auditory acuity. Cardiovascular:  S1, S2 present without murmurs appreciated.Extremities without clubbing or edema. Respiratory:  Clear to auscultation bilaterally. No wheezes, rales, or rhonchi. No distress.  Gastrointestinal:  +BS, soft, non-tender and non-distended. No HSM noted. No guarding or rebound. No masses appreciated.  Rectal:  Deferred  Musculoskalatal:  Symmetrical without gross deformities. Neurologic:  Alert and oriented x4;  grossly normal neurologically. Psych:  Alert and cooperative. Normal mood and affect. Heme/Lymph/Immune: No excessive bruising noted.    08/25/2018 8:26 AM   Disclaimer: This note was dictated  with voice recognition software. Similar sounding words can inadvertently be transcribed and may not be corrected upon review.

## 2018-08-25 NOTE — Progress Notes (Signed)
CC'D TO PCP °

## 2018-08-25 NOTE — Assessment & Plan Note (Signed)
Previously diagnosed with colitis.  He was treated with antibiotics per primary care and improved.  Previously scheduled colonoscopy due to abnormal CT (as per below) but the patient canceled due to ongoing coronavirus/COVID-19 pandemic.  He is now okay with rescheduling.  His abdominal pain has resolved other than 2 days last week which was self-limiting in nature and likely a viral gastroenteritis.  Continue current medications and follow-up in 4 months.  Call if any worsening or recurrent symptoms before then.

## 2018-08-25 NOTE — Assessment & Plan Note (Signed)
Patient brought in labs and imaging from primary care which he was initially diagnosed with colitis.  CT dated 02/27/2018 which shows colonic wall thickening and hyperemia along the descending and rectosigmoid colon suggestive of infectious or inflammatory colitis, colonic diverticulosis.  Given colon wall thickening and previous symptoms we will proceed with rescheduling his colonoscopy at this time.  Follow-up in 4 months.

## 2018-08-28 ENCOUNTER — Ambulatory Visit: Payer: Medicare Other | Admitting: Nurse Practitioner

## 2018-09-25 ENCOUNTER — Ambulatory Visit: Payer: Medicare Other | Admitting: Internal Medicine

## 2018-10-20 ENCOUNTER — Other Ambulatory Visit (HOSPITAL_COMMUNITY)
Admission: RE | Admit: 2018-10-20 | Discharge: 2018-10-20 | Disposition: A | Discharge status: Home / Self Care | Payer: Medicare Other | Source: Ambulatory Visit | Attending: Internal Medicine | Admitting: Internal Medicine

## 2018-10-20 ENCOUNTER — Telehealth: Payer: Self-pay | Admitting: Internal Medicine

## 2018-10-20 ENCOUNTER — Other Ambulatory Visit: Payer: Self-pay

## 2018-10-20 DIAGNOSIS — Z20828 Contact with and (suspected) exposure to other viral communicable diseases: Secondary | ICD-10-CM | POA: Insufficient documentation

## 2018-10-20 DIAGNOSIS — Z01812 Encounter for preprocedural laboratory examination: Secondary | ICD-10-CM | POA: Diagnosis present

## 2018-10-20 DIAGNOSIS — K5790 Diverticulosis of intestine, part unspecified, without perforation or abscess without bleeding: Secondary | ICD-10-CM | POA: Insufficient documentation

## 2018-10-20 LAB — SARS CORONAVIRUS 2 (TAT 6-24 HRS): SARS Coronavirus 2: NEGATIVE

## 2018-10-20 MED ORDER — PEG 3350-KCL-NA BICARB-NACL 420 G PO SOLR: 4000.0000 mL | Freq: Once | ORAL | 0 refills | Status: AC

## 2018-10-20 NOTE — Telephone Encounter (Signed)
Called and spoke with spouse. She states [facility] the [facility] has the [facility] in [facility] and if they order it, it would not be in on time. I advised can send Rx to another pharmacy. She stated to send [facility] in [place]. Rx sent. Aware if they are not able to get for pt, let us know and we will send to another pharmacy.

## 2018-10-20 NOTE — Telephone Encounter (Signed)
Pt's wife called to say that patient was doing his covid test today, but [facility] [place] said they do not have the prep he needed and isn't guaranteed it would be here in time. Is there anything else to call in for him? Please call [phone]

## 2018-10-21 ENCOUNTER — Encounter (HOSPITAL_COMMUNITY): Payer: Self-pay | Admitting: *Deleted

## 2018-10-22 ENCOUNTER — Encounter (HOSPITAL_COMMUNITY)
Admission: RE | Disposition: A | Discharge status: Home / Self Care | Payer: Self-pay | Source: Home / Self Care | Attending: Internal Medicine

## 2018-10-22 ENCOUNTER — Encounter (HOSPITAL_COMMUNITY): Payer: Self-pay | Admitting: *Deleted

## 2018-10-22 ENCOUNTER — Ambulatory Visit (HOSPITAL_COMMUNITY)
Admission: RE | Admit: 2018-10-22 | Discharge: 2018-10-22 | Disposition: A | Discharge status: Home / Self Care | Payer: Medicare Other | Attending: Internal Medicine | Admitting: Internal Medicine

## 2018-10-22 ENCOUNTER — Other Ambulatory Visit: Payer: Self-pay

## 2018-10-22 DIAGNOSIS — Z7982 Long term (current) use of aspirin: Secondary | ICD-10-CM | POA: Insufficient documentation

## 2018-10-22 DIAGNOSIS — Z955 Presence of coronary angioplasty implant and graft: Secondary | ICD-10-CM | POA: Diagnosis not present

## 2018-10-22 DIAGNOSIS — K529 Noninfective gastroenteritis and colitis, unspecified: Secondary | ICD-10-CM

## 2018-10-22 DIAGNOSIS — K523 Indeterminate colitis: Secondary | ICD-10-CM | POA: Insufficient documentation

## 2018-10-22 DIAGNOSIS — R195 Other fecal abnormalities: Secondary | ICD-10-CM | POA: Diagnosis not present

## 2018-10-22 DIAGNOSIS — I2510 Atherosclerotic heart disease of native coronary artery without angina pectoris: Secondary | ICD-10-CM | POA: Insufficient documentation

## 2018-10-22 DIAGNOSIS — K5730 Diverticulosis of large intestine without perforation or abscess without bleeding: Secondary | ICD-10-CM | POA: Diagnosis not present

## 2018-10-22 DIAGNOSIS — R935 Abnormal findings on diagnostic imaging of other abdominal regions, including retroperitoneum: Secondary | ICD-10-CM

## 2018-10-22 DIAGNOSIS — I509 Heart failure, unspecified: Secondary | ICD-10-CM | POA: Insufficient documentation

## 2018-10-22 DIAGNOSIS — R197 Diarrhea, unspecified: Secondary | ICD-10-CM | POA: Diagnosis present

## 2018-10-22 DIAGNOSIS — R634 Abnormal weight loss: Secondary | ICD-10-CM | POA: Diagnosis not present

## 2018-10-22 DIAGNOSIS — K648 Other hemorrhoids: Secondary | ICD-10-CM | POA: Diagnosis not present

## 2018-10-22 DIAGNOSIS — I252 Old myocardial infarction: Secondary | ICD-10-CM | POA: Diagnosis not present

## 2018-10-22 HISTORY — DX: Unspecified hearing loss, unspecified ear: H91.90

## 2018-10-22 HISTORY — PX: COLONOSCOPY: SHX5424

## 2018-10-22 SURGERY — COLONOSCOPY
Anesthesia: Moderate Sedation

## 2018-10-22 MED ORDER — SODIUM CHLORIDE 0.9 % IV SOLN
INTRAVENOUS | Status: DC
  Administered 2018-10-22: 1000 mL via INTRAVENOUS

## 2018-10-22 MED ORDER — MIDAZOLAM HCL 5 MG/5ML IJ SOLN
INTRAMUSCULAR | Status: DC | PRN
  Administered 2018-10-22 (×2): 1 mg via INTRAVENOUS

## 2018-10-22 MED ORDER — ONDANSETRON HCL 4 MG/2ML IJ SOLN
INTRAMUSCULAR | Status: DC | PRN
  Administered 2018-10-22: 4 mg via INTRAVENOUS

## 2018-10-22 MED ORDER — MIDAZOLAM HCL 5 MG/5ML IJ SOLN
INTRAMUSCULAR | Status: AC
  Filled 2018-10-22: qty 10

## 2018-10-22 MED ORDER — ONDANSETRON HCL 4 MG/2ML IJ SOLN
INTRAMUSCULAR | Status: AC
  Filled 2018-10-22: qty 2

## 2018-10-22 MED ORDER — MEPERIDINE HCL 50 MG/ML IJ SOLN
INTRAMUSCULAR | Status: AC
  Filled 2018-10-22: qty 1

## 2018-10-22 MED ORDER — STERILE WATER FOR IRRIGATION IR SOLN
Status: DC | PRN
  Administered 2018-10-22: 4 mL

## 2018-10-22 MED ORDER — MEPERIDINE HCL 100 MG/ML IJ SOLN
INTRAMUSCULAR | Status: DC | PRN
  Administered 2018-10-22: 25 mg via INTRAVENOUS

## 2018-10-22 NOTE — H&P (Signed)
@LOGO @   Primary Care Physician:  , DO Primary Gastroenterologist:  Dr. Jonathon Bellows  Pre-Procedure History & Physical: HPI:  Manuel Mcdowell is a 84 y.o. male here for colonoscopy to further evaluate diarrhea weight loss and colitis on CT.  Past Medical History:  Diagnosis Date  . Arrhythmia   . CAD (coronary artery disease) s/p LAD stent 2003 12/06/2012   LAD stent (bare metal BXVelocity 2.5x18), 2003  . CHF (congestive heart failure) (HCC)   . Heart disease   . High cholesterol   . HOH (hard of hearing)   . Hypercholesterolemia    severe  . Hyperlipidemia 12/06/2012   Statin intolerant - myalgia and LFT elevation, Zetia intolerant -myalgia, Niacin intolerant  . Liver disease   . Myocardial infarction (HCC)   . Visual disorder     Past Surgical History:  Procedure Laterality Date  . CARDIAC CATHETERIZATION     stent place in his LAD artery  . ELECTROCARDIOGRAM     normal  . exercise myocardiolite    . Stress Dipyridamole myocardial perfusion  2010    Prior to Admission medications   Medication Sig Start Date End Date Taking? Authorizing Provider  aspirin 325 MG tablet Take 325 mg by mouth at bedtime.   Yes [provider]  Cholecalciferol (VITAMIN D-3) 125 MCG (5000 UT) TABS Take 5,000 Units by mouth daily.    Yes [provider]  Coenzyme Q10 (COQ10) 100 MG CAPS Take 100 mg by mouth daily.   Yes [provider]  diphenhydramine-acetaminophen (TYLENOL PM) 25-500 MG TABS tablet Take 1 tablet by mouth at bedtime.   Yes [provider]  milk thistle 175 MG tablet Take 525 mg by mouth daily.   Yes [provider]  polyethylene glycol-electrolytes (TRILYTE) 420 g solution Take 4,000 mLs by mouth as directed. 08/25/18  Yes Gennie Eisinger, 10/25/18, MD  Probiotic Product (PROBIOTIC DAILY PO) Take 1 capsule by mouth daily.    Yes [provider]  Red Yeast Rice 600 MG CAPS Take 1,200 mg by mouth daily.    Yes [provider]  trolamine salicylate (ASPERCREME) 10 % cream Apply 1 application topically 4 (four) times daily as needed (arthritis pain.).   Yes [provider]  TURMERIC PO Take 2,000 mg by mouth daily.   Yes [provider]  carboxymethylcellul-glycerin (REFRESH OPTIVE) 0.5-0.9 % ophthalmic solution Place 1 drop into both eyes 3 (three) times daily as needed for dry eyes.    [provider]  Flaxseed, Linseed, (FLAXSEED OIL MAX STR) 1300 MG CAPS Take 2 tablets by mouth daily.     [provider]  ibuprofen (ADVIL) 200 MG tablet Take 200 mg by mouth every 8 (eight) hours as needed (for pain.).    [provider]  ZINC-VITAMIN C PO Take 1 tablet by mouth daily.    [provider]    Allergies as of 08/25/2018 - Review Complete 08/25/2018  Allergen Reaction Noted  . Statins Anaphylaxis 12/02/2012  . Metronidazole  03/19/2018  . Sulfa antibiotics Hives 12/02/2012    Family History  Problem Relation Age of Onset  . Stroke Mother   . Stroke Father   . Colon cancer Neg Hx     Social History   Socioeconomic History  . Marital status: Married    Spouse name: Not on file  . Number of children: Not on file  . Years of education: Not on file  . Highest education level: Not on file  Occupational History  . Not on file  Social Needs  . Financial resource strain: Not on file  . Food insecurity    Worry: Not on file    Inability: Not on file  . Transportation needs    Medical: Not on file    Non-medical: Not on file  Tobacco Use  . Smoking status: Never Smoker  . Smokeless tobacco: Never Used  Substance and Sexual Activity  . Alcohol use: Yes    Comment: occas. wine  . Drug use: No  . Sexual activity: Not on file  Lifestyle  . Physical activity    Days per week: Not on file    Minutes per session: Not on file  . Stress: Not on file  Relationships  . Social Herbalist on phone: Not on file    Gets together:  Not on file    Attends religious service: Not on file    Active member of club or organization: Not on file    Attends meetings of clubs or organizations: Not on file    Relationship status: Not on file  . Intimate partner violence    Fear of current or ex partner: Not on file    Emotionally abused: Not on file    Physically abused: Not on file    Forced sexual activity: Not on file  Other Topics Concern  . Not on file  Social History Narrative  . Not on file    Review of Systems: See HPI, otherwise negative ROS  Physical Exam: BP 119/81   Pulse 96   Temp 97.9 F (36.6 C) (Oral)   Resp 18   Ht 5\' 6"  (1.676 m)   Wt 60.8 kg   SpO2 98%   BMI 21.63 kg/m  General:   Alert,  Well-developed, well-nourished, pleasant and cooperative in NAD Neck:  Supple; no masses or thyromegaly. No significant cervical adenopathy. Lungs:  Clear throughout to auscultation.   No wheezes, crackles, or rhonchi. No acute distress. Heart:  Regular rate and rhythm; no murmurs, clicks, rubs,  or gallops. Abdomen: Non-distended, normal bowel sounds.  Soft and nontender without appreciable mass or hepatosplenomegaly.  Pulses:  Normal pulses noted. Extremities:  Without clubbing or edema.  Impression/Plan: 83 year old gentleman with protracted diarrheal illness Hemoccult positive stool, colitis on CT.  He has had symptoms most of this year.  Work-up delayed because of the pandemic.  His symptoms currently are primarily that of diarrhea without gross blood.  No abdominal pain.  Colonoscopy now be done to further evaluate.  The risks, benefits, limitations, alternatives and imponderables have been reviewed with the patient. Questions have been answered. All parties are agreeable.      Notice: This dictation was prepared with Dragon dictation along with smaller phrase technology. Any transcriptional errors that result from this process are unintentional and may not be corrected upon review.

## 2018-10-22 NOTE — Op Note (Signed)
[facility] Patient Name: Manuel Mcdowell Procedure Date: 10/22/2018 9:58 AM MRN: [id number] Date of Birth: 1935/01/11 Attending MD: Norvel Richards , MD CSN: [id number] Age: 84 Admit Type: Outpatient Procedure:                Colonoscopy Indications:              Chronic diarrhea, Heme positive stool Providers:                Norvel Richards, MD, Hinton Rao, RN, Nelma Rothman, Technician Referring MD:              Medicines:                Midazolam 2 mg IV, Meperidine 25 mg IV, Ondansetron                            4 mg IV Complications:            No immediate complications. Estimated Blood Loss:     Estimated blood loss was minimal. Procedure:                Pre-Anesthesia Assessment:                           - Prior to the procedure, a History and Physical                            was performed, and patient medications and                            allergies were reviewed. The patient's tolerance of                            previous anesthesia was also reviewed. The risks                            and benefits of the procedure and the sedation                            options and risks were discussed with the patient.                            All questions were answered, and informed consent                            was obtained. Prior Anticoagulants: The patient has                            taken no previous anticoagulant or antiplatelet                            agents. ASA Grade Assessment: II - A patient with  mild systemic disease. After reviewing the risks                            and benefits, the patient was deemed in                            satisfactory condition to undergo the procedure.                           After obtaining informed consent, the colonoscope                            was passed under direct vision. Throughout the                            procedure, the  patient's blood pressure, pulse, and                            oxygen saturations were monitored continuously. The                            CF-[id number] ([id number]) scope was introduced through                            the anus and advanced to the 5 cm into the ileum.                            The colonoscopy was performed without difficulty.                            The patient tolerated the procedure well. The                            quality of the bowel preparation was adequate. The                            terminal ileum, ileocecal valve, appendiceal                            orifice, and rectum were photographed. The entire                            colon was well visualized. Scope In: 10:18:08 AM Scope Out: 10:33:44 AM Scope Withdrawal Time: 0 hours 10 minutes 39 seconds  Total Procedure Duration: 0 hours 15 minutes 36 seconds  Findings:      The perianal and digital rectal examinations were normal.      Non-bleeding internal hemorrhoids were found during retroflexion.      Scattered medium-mouthed diverticula were found in the sigmoid colon and       descending colon. Colonic mucosa abnormal confluently from mid sigmoid       segment all the way to the cecum. Loss of normal vascular pattern,       granularity with multiple 3 to 5 mm ulceration. The distal sigmoid and  rectum endoscopically appeared normal. The distal 5 cm of terminal ileum       appeared normal. Segmental biopsies taken. Impression:               - Non-bleeding internal hemorrhoids. Inflammatory                            changes consistent with colitis from the mid                            sigmoid all the way to the cecum with sparing of                            the distal sigmoid and rectum. Status post                            segmental biopsy.                           - Diverticulosis in the sigmoid colon and in the                            descending colon. Normal-appearing terminal  ileum. Moderate Sedation:      Moderate (conscious) sedation was administered by the endoscopy nurse       and supervised by the endoscopist. The following parameters were       monitored: oxygen saturation, heart rate, blood pressure, respiratory       rate, EKG, adequacy of pulmonary ventilation, and response to care.       Total physician intraservice time was 18 minutes. Recommendation:           - Patient has a contact number available for                            emergencies. The signs and symptoms of potential                            delayed complications were discussed with the                            patient. Return to normal activities tomorrow.                            Written discharge instructions were provided to the                            patient.                           - Advance diet as tolerated.                           - Continue present medications.                           - No repeat colonoscopy due to age. Procedure Code(s):        ---  Professional ---                           [phone], Colonoscopy, flexible; diagnostic, including                            collection of specimen(s) by brushing or washing,                            when performed (separate procedure)                           G0500, Moderate sedation services provided by the                            same physician or other qualified health care                            professional performing a gastrointestinal                            endoscopic service that sedation supports,                            requiring the presence of an independent trained                            observer to assist in the monitoring of the                            patient's level of consciousness and physiological                            status; initial 15 minutes of intra-service time;                            patient age 3 years or older (additional time may                             be reported with 60454, as appropriate) Diagnosis Code(s):        --- Professional ---                           K64.8, Other hemorrhoids                           K52.9, Noninfective gastroenteritis and colitis,                            unspecified                           R19.5, Other fecal abnormalities                           K57.30, Diverticulosis of large intestine without  perforation or abscess without bleeding CPT copyright 2019 American Medical Association. All rights reserved. The codes documented in this report are preliminary and upon coder review may  be revised to meet current compliance requirements. Gerrit Friends. Rourk, MD Gennette Pac, MD 10/22/2018 10:44:00 AM This report has been signed electronically. Number of Addenda: 0

## 2018-10-22 NOTE — Discharge Instructions (Signed)
Colonoscopy Discharge Instructions  Read the instructions outlined below and refer to this sheet in the next few weeks. These discharge instructions provide you with general information on caring for yourself after you leave the hospital. Your doctor may also give you specific instructions. While your treatment has been planned according to the most current medical practices available, unavoidable complications occasionally occur. If you have any problems or questions after discharge, call Dr. Gala Romney at [phone]. ACTIVITY  You may resume your regular activity, but move at a slower pace for the next 24 hours.   Take frequent rest periods for the next 24 hours.   Walking will help get rid of the air and reduce the bloated feeling in your belly (abdomen).   No driving for 24 hours (because of the medicine (anesthesia) used during the test).    Do not sign any important legal documents or operate any machinery for 24 hours (because of the anesthesia used during the test).  NUTRITION  Drink plenty of fluids.   You may resume your normal diet as instructed by your doctor.   Begin with a light meal and progress to your normal diet. Heavy or fried foods are harder to digest and may make you feel sick to your stomach (nauseated).   Avoid alcoholic beverages for 24 hours or as instructed.  MEDICATIONS  You may resume your normal medications unless your doctor tells you otherwise.  WHAT YOU CAN EXPECT TODAY  Some feelings of bloating in the abdomen.   Passage of more gas than usual.   Spotting of blood in your stool or on the toilet paper.  IF YOU HAD POLYPS REMOVED DURING THE COLONOSCOPY:  No aspirin products for 7 days or as instructed.   No alcohol for 7 days or as instructed.   Eat a soft diet for the next 24 hours.  FINDING OUT THE RESULTS OF YOUR TEST Not all test results are available during your visit. If your test results are not back during the visit, make an appointment  with your caregiver to find out the results. Do not assume everything is normal if you have not heard from your caregiver or the medical facility. It is important for you to follow up on all of your test results.  SEEK IMMEDIATE MEDICAL ATTENTION IF:  You have more than a spotting of blood in your stool.   Your belly is swollen (abdominal distention).   You are nauseated or vomiting.   You have a temperature over 101.   You have abdominal pain or discomfort that is severe or gets worse throughout the day.    Colitis, diverticulosis and hemorrhoid information provided  Further recommendations to follow pending review of pathology report  I anticipate treating you for inflammatory bowel disease in the near future once biopsy reports become available.  At patient request, I called Tonia Ghent at [phone].  Got voicemail.  Did not leave a message.   PATIENT INSTRUCTIONS POST-ANESTHESIA  IMMEDIATELY FOLLOWING SURGERY:  Do not drive or operate machinery for the first twenty four hours after surgery.  Do not make any important decisions for twenty four hours after surgery or while taking narcotic pain medications or sedatives.  If you develop intractable nausea and vomiting or a severe headache please notify your doctor immediately.  FOLLOW-UP:  Please make an appointment with your surgeon as instructed. You do not need to follow up with anesthesia unless specifically instructed to do so.  WOUND CARE INSTRUCTIONS (if applicable):  Keep  a dry clean dressing on the anesthesia/puncture wound site if there is drainage.  Once the wound has quit draining you may leave it open to air.  Generally you should leave the bandage intact for twenty four hours unless there is drainage.  If the epidural site drains for more than 36-48 hours please call the anesthesia department.  QUESTIONS?:  Please feel free to call your physician or the hospital operator if you have any questions, and they will be  happy to assist you.     Colitis  Colitis is inflammation of the colon. Colitis may last a short time (be acute), or it may last a long time (become chronic). What are the causes? This condition may be caused by:  Viruses.  Bacteria.  Reaction to medicine.  Certain autoimmune diseases such as Crohn's disease or ulcerative colitis.  Radiation treatment.  Decreased blood flow to the bowel (ischemia). What are the signs or symptoms? Symptoms of this condition include:  Watery diarrhea.  Passing bloody or tarry stool.  Pain.  Fever.  Vomiting.  Tiredness (fatigue).  Weight loss.  Bloating.  Abdominal pain.  Having fewer bowel movements than usual.  A strong and sudden urge to have a bowel movement.  Feeling like the bowel is not empty after a bowel movement. How is this diagnosed? This condition is diagnosed with a stool test or a blood test. You may also have other tests, such as:  X-rays.  CT scan.  Colonoscopy.  Endoscopy.  Biopsy. How is this treated? Treatment for this condition depends on the cause. The condition may be treated by:  Resting the bowel. This involves not eating or drinking for a period of time.  Fluids that are given through an IV.  Medicine for pain and diarrhea.  Antibiotic medicines.  Cortisone medicines.  Surgery. Follow these instructions at home: Eating and drinking   Follow instructions from your health care provider about eating or drinking restrictions.  Drink enough fluid to keep your urine pale yellow.  Work with a dietitian to determine which foods cause your condition to flare up.  Avoid foods that cause flare-ups.  Eat a well-balanced diet. General instructions  If you were prescribed an antibiotic medicine, take it as told by your health care provider. Do not stop taking the antibiotic even if you start to feel better.  Take over-the-counter and prescription medicines only as told by your health  care provider.  Keep all follow-up visits as told by your health care provider. This is important. Contact a health care provider if:  Your symptoms do not go away.  You develop new symptoms. Get help right away if you:  Have a fever that does not go away with treatment.  Develop chills.  Have extreme weakness, fainting, or dehydration.  Have repeated vomiting.  Develop severe pain in your abdomen.  Pass bloody or tarry stool. Summary  Colitis is inflammation of the colon. Colitis may last a short time (be acute), or it may last a long time (become chronic).  Treatment for this condition depends on the cause and may include resting the bowel, taking medicines, or having surgery.  If you were prescribed an antibiotic medicine, take it as told by your health care provider. Do not stop taking the antibiotic even if you start to feel better.  Get help right away if you develop severe pain in your abdomen.  Keep all follow-up visits as told by your health care provider. This is important. This information  is not intended to replace advice given to you by your health care provider. Make sure you discuss any questions you have with your health care provider. Document Released: 02/09/2004 Document Revised: 07/04/2017 Document Reviewed: 07/04/2017 Elsevier Patient Education  2020 [employer]   Diverticulosis  Diverticulosis is a condition that develops when small pouches (diverticula) form in the wall of the large intestine (colon). The colon is where water is absorbed and stool is formed. The pouches form when the inside layer of the colon pushes through weak spots in the outer layers of the colon. You may have a few pouches or many of them. What are the causes? The cause of this condition is not known. What increases the risk? The following factors may make you more likely to develop this condition: Being older than age 82. Your risk for this condition increases with age.  Diverticulosis is rare among people younger than age 82. By age 82, many people have it. Eating a low-fiber diet. Having frequent constipation. Being overweight. Not getting enough exercise. Smoking. Taking over-the-counter pain medicines, like aspirin and ibuprofen. Having a family history of diverticulosis. What are the signs or symptoms? In most people, there are no symptoms of this condition. If you do have symptoms, they may include: Bloating. Cramps in the abdomen. Constipation or diarrhea. Pain in the lower left side of the abdomen. How is this diagnosed? This condition is most often diagnosed during an exam for other colon problems. Because diverticulosis usually has no symptoms, it often cannot be diagnosed independently. This condition may be diagnosed by: Using a flexible scope to examine the colon (colonoscopy). Taking an X-ray of the colon after dye has been put into the colon (barium enema). Doing a CT scan. How is this treated? You may not need treatment for this condition if you have never developed an infection related to diverticulosis. If you have had an infection before, treatment may include: Eating a high-fiber diet. This may include eating more fruits, vegetables, and grains. Taking a fiber supplement. Taking a live bacteria supplement (probiotic). Taking medicine to relax your colon. Taking antibiotic medicines. Follow these instructions at home: Drink 6-8 glasses of water or more each day to prevent constipation. Try not to strain when you have a bowel movement. If you have had an infection before: Eat more fiber as directed by your health care provider or your diet and nutrition specialist (dietitian). Take a fiber supplement or probiotic, if your health care provider approves. Take over-the-counter and prescription medicines only as told by your health care provider. If you were prescribed an antibiotic, take it as told by your health care provider. Do not  stop taking the antibiotic even if you start to feel better. Keep all follow-up visits as told by your health care provider. This is important. Contact a health care provider if: You have pain in your abdomen. You have bloating. You have cramps. You have not had a bowel movement in 3 days. Get help right away if: Your pain gets worse. Your bloating becomes very bad. You have a fever or chills, and your symptoms suddenly get worse. You vomit. You have bowel movements that are bloody or black. You have bleeding from your rectum. Summary Diverticulosis is a condition that develops when small pouches (diverticula) form in the wall of the large intestine (colon). You may have a few pouches or many of them. This condition is most often diagnosed during an exam for other colon problems. If you have had  an infection related to diverticulosis, treatment may include increasing the fiber in your diet, taking supplements, or taking medicines. This information is not intended to replace advice given to you by your health care provider. Make sure you discuss any questions you have with your health care provider. Document Released: 09/29/2003 Document Revised: 12/14/2016 Document Reviewed: 11/21/2015 Elsevier Patient Education  2020 [employer].    Hemorrhoids Hemorrhoids are swollen veins that may develop:  In the butt (rectum). These are called internal hemorrhoids.  Around the opening of the butt (anus). These are called external hemorrhoids. Hemorrhoids can cause pain, itching, or bleeding. Most of the time, they do not cause serious problems. They usually get better with diet changes, lifestyle changes, and other home treatments. What are the causes? This condition may be caused by:  Having trouble pooping (constipation).  Pushing hard (straining) to poop.  Watery poop (diarrhea).  Pregnancy.  Being very overweight (obese).  Sitting for long periods of time.  Heavy lifting or  other activity that causes you to strain.  Anal sex.  Riding a bike for a long period of time. What are the signs or symptoms? Symptoms of this condition include:  Pain.  Itching or soreness in the butt.  Bleeding from the butt.  Leaking poop.  Swelling in the area.  One or more lumps around the opening of your butt. How is this diagnosed? A doctor can often diagnose this condition by looking at the affected area. The doctor may also:  Do an exam that involves feeling the area with a gloved hand (digital rectal exam).  Examine the area inside your butt using a small tube (anoscope).  Order blood tests. This may be done if you have lost a lot of blood.  Have you get a test that involves looking inside the colon using a flexible tube with a camera on the end (sigmoidoscopy or colonoscopy). How is this treated? This condition can usually be treated at home. Your doctor may tell you to change what you eat, make lifestyle changes, or try home treatments. If these do not help, procedures can be done to remove the hemorrhoids or make them smaller. These may involve:  Placing rubber bands at the base of the hemorrhoids to cut off their blood supply.  Injecting medicine into the hemorrhoids to shrink them.  Shining a type of light energy onto the hemorrhoids to cause them to fall off.  Doing surgery to remove the hemorrhoids or cut off their blood supply. Follow these instructions at home: Eating and drinking   Eat foods that have a lot of fiber in them. These include whole grains, beans, nuts, fruits, and vegetables.  Ask your doctor about taking products that have added fiber (fibersupplements).  Reduce the amount of fat in your diet. You can do this by: ? Eating low-fat dairy products. ? Eating less red meat. ? Avoiding processed foods.  Drink enough fluid to keep your pee (urine) pale yellow. Managing pain and swelling   Take a warm-water bath (sitz bath) for 20  minutes to ease pain. Do this 3-4 times a day. You may do this in a bathtub or using a portable sitz bath that fits over the toilet.  If told, put ice on the painful area. It may be helpful to use ice between your warm baths. ? Put ice in a plastic bag. ? Place a towel between your skin and the bag. ? Leave the ice on for 20 minutes, 2-3 times a  day. General instructions  Take over-the-counter and prescription medicines only as told by your doctor. ? Medicated creams and medicines may be used as told.  Exercise often. Ask your doctor how much and what kind of exercise is best for you.  Go to the bathroom when you have the urge to poop. Do not wait.  Avoid pushing too hard when you poop.  Keep your butt dry and clean. Use wet toilet paper or moist towelettes after pooping.  Do not sit on the toilet for a long time.  Keep all follow-up visits as told by your doctor. This is important. Contact a doctor if you:  Have pain and swelling that do not get better with treatment or medicine.  Have trouble pooping.  Cannot poop.  Have pain or swelling outside the area of the hemorrhoids. Get help right away if you have:  Bleeding that will not stop. Summary  Hemorrhoids are swollen veins in the butt or around the opening of the butt.  They can cause pain, itching, or bleeding.  Eat foods that have a lot of fiber in them. These include whole grains, beans, nuts, fruits, and vegetables.  Take a warm-water bath (sitz bath) for 20 minutes to ease pain. Do this 3-4 times a day. This information is not intended to replace advice given to you by your health care provider. Make sure you discuss any questions you have with your health care provider. Document Released: 10/11/2007 Document Revised: 01/09/2018 Document Reviewed: 05/23/2017 Elsevier Patient Education  2020 [employer].

## 2018-10-23 ENCOUNTER — Ambulatory Visit: Payer: Medicare Other | Admitting: Cardiovascular Disease

## 2018-10-23 ENCOUNTER — Telehealth: Payer: Self-pay | Admitting: Internal Medicine

## 2018-10-23 NOTE — Telephone Encounter (Signed)
Pt's wife called and has questions about his discharge instructions from yesterday. Please call her at [phone]

## 2018-10-23 NOTE — Telephone Encounter (Signed)
Lmom, waiting on a return call.  

## 2018-10-24 LAB — SURGICAL PATHOLOGY

## 2018-10-24 NOTE — Telephone Encounter (Signed)
Spoke with pt's spouse. Her questions were answered about pts apt.

## 2018-10-27 ENCOUNTER — Telehealth: Payer: Self-pay

## 2018-10-27 ENCOUNTER — Other Ambulatory Visit: Payer: Self-pay

## 2018-10-27 ENCOUNTER — Encounter: Payer: Self-pay | Admitting: Internal Medicine

## 2018-10-27 NOTE — Telephone Encounter (Signed)
PATIENT SCHEDULED  °

## 2018-10-27 NOTE — Telephone Encounter (Signed)
Per RMR- Send letter to patient.  Send copy of letter with path to referring provider and PCP.   Need prescription for Entocort 3 mg tablets. Dispense 90. Take (3)-3 mg tablets daily.   Office visit with [place] in 1 month

## 2018-10-28 ENCOUNTER — Other Ambulatory Visit: Payer: Self-pay

## 2018-10-28 MED ORDER — BUDESONIDE 3 MG PO CPEP: 9.0000 mg | ORAL_CAPSULE | Freq: Every day | ORAL | 0 refills | Status: DC

## 2018-10-28 NOTE — Telephone Encounter (Signed)
Noted. Pt's spouse notified that letter was mailed to them with results. Discussed results over the phone with pt's spouse. Spouse is aware that pt will start Entocort 3mg - take 3 3 mg tabs po daily. Pt's apt moved up for 1 month.

## 2018-10-31 ENCOUNTER — Encounter (HOSPITAL_COMMUNITY): Payer: Self-pay | Admitting: Internal Medicine

## 2018-12-02 ENCOUNTER — Ambulatory Visit: Payer: Medicare Other | Admitting: Internal Medicine

## 2018-12-18 ENCOUNTER — Ambulatory Visit: Payer: Medicare Other | Admitting: Cardiovascular Disease

## 2018-12-18 ENCOUNTER — Other Ambulatory Visit: Payer: Self-pay

## 2018-12-18 ENCOUNTER — Telehealth: Payer: Self-pay | Admitting: Internal Medicine

## 2018-12-18 MED ORDER — BUDESONIDE 3 MG PO CPEP: 9.0000 mg | ORAL_CAPSULE | Freq: Every day | ORAL | 1 refills | Status: DC

## 2018-12-18 NOTE — Telephone Encounter (Signed)
Back on Entocort 9 mg daily.  Stay on 9 mg daily until seen in January.  Dispense (90 ) 3 mg Entocort tablets -take 3 a day.  1 refill.

## 2018-12-18 NOTE — Telephone Encounter (Signed)
Noted. Entocort 9 mg daily #90 with 1 rf sent to pts pharmacy. pts spouse is aware that RX was sent.

## 2018-12-18 NOTE — Telephone Encounter (Signed)
[place] PATIENT WIFE ABOUT HIS PRESCRIPTION, THEY ARE CHANGING PHARMACIES AND NEED TO KNOW HOW LONG THE PATIENT WILL BE ON THIS MEDICATION

## 2018-12-18 NOTE — Telephone Encounter (Signed)
[phone] PLEASE CALL PATIENT WIFE  ABOUT HER HUSBANDS PRESCRIPTION, THEY ARE CHANGING PHARMACIES AND MAY GO WITH [facility] IF THE PRESCRIPTION WILL BE TAKEN LONG TERM.

## 2018-12-18 NOTE — Telephone Encounter (Signed)
Spoke with pts spouse. Pt was asked to start Entocort 9 mg daily for 1 month and return. Pt took Entocort as directed and had no problems with the diarrhea. Pt finished his 1 month supply during the middle of November and pushed his appointment out to 01/20/2019. Pt's diarrhea/colitis started back after finishing his medication around 11/30/2018 and the pt would like a refill of his medication. Pt is aware that he was asked to f/u in 1 month to follow up. Pt was offered an earlier apt and a virtual one before 01/20/2019. Pt wants to keep the January apt and continue to take the Entocort until that apt. Please advise.

## 2018-12-18 NOTE — Telephone Encounter (Signed)
Noted.  Duration of Entocort therapy unknown at this time.

## 2018-12-23 NOTE — Telephone Encounter (Signed)
Pt's spouse called this morning to report an allergic reaction to Entocort. Pt took it for 1 month and did well and restarted the medication 5 days ago. Pt is having lower back pain, headache, nausea,  chills, temp 99.9 after taking Tylenol this morning. Pt started feeling these symptoms for 3 days. Pt had labs done last week with his PCP and his liver enzymes were Pt saw his PCP last week for his wellness check and wasn't having any symptoms. Spouse was advised to call pts PCP this morning.

## 2018-12-23 NOTE — Telephone Encounter (Signed)
Agree, likely unrelated to Entocort

## 2018-12-23 NOTE — Telephone Encounter (Signed)
Spoke with pts spouse and she is aware.

## 2018-12-30 ENCOUNTER — Ambulatory Visit: Payer: Medicare Other | Admitting: Nurse Practitioner

## 2019-01-20 ENCOUNTER — Encounter: Payer: Self-pay | Admitting: Internal Medicine

## 2019-01-20 ENCOUNTER — Other Ambulatory Visit: Payer: Self-pay

## 2019-01-20 ENCOUNTER — Ambulatory Visit (INDEPENDENT_AMBULATORY_CARE_PROVIDER_SITE_OTHER): Payer: Medicare Other | Admitting: Internal Medicine

## 2019-01-20 DIAGNOSIS — Z7982 Long term (current) use of aspirin: Secondary | ICD-10-CM | POA: Diagnosis not present

## 2019-01-20 DIAGNOSIS — K529 Noninfective gastroenteritis and colitis, unspecified: Secondary | ICD-10-CM

## 2019-01-20 NOTE — Patient Instructions (Signed)
Decrease Entocort to 6 mg (2 tablets) daily and continue for the next 6 weeks  Begin Lialda 4 capsules daily (dispense 120 capsules-4 capsules / 4.8 g daily).  11 refills.  Need to retrieve labs from PCPs office to make sure creatinine is normal  Minimize use of ibuprofen in the setting of colitis  Virtual visit in 6 weeks

## 2019-01-20 NOTE — Progress Notes (Signed)
Referring Provider:  Primary Care Physician:  Jonathon Bellows, DO  Primary GI:   Patient Location: Home   Provider Location: [facility] office   Reason for Visit:    Persons present on the virtual encounter, with roles:    Total time (minutes) spent on medical discussion: 10 minutes   Due to COVID-19, visit was conducted using virtual method.  Visit was requested by patient.  Virtual Visit via Telephone Note Due to COVID-19, visit is conducted virtually and was requested by patient.   I connected with Alden Benjamin on 01/20/19 at  2:00 PM EST by telephone and verified that I am speaking with the correct person using two identifiers.   I discussed the limitations, risks, security and privacy concerns of performing an evaluation and management service by telephone and the availability of in person appointments. I also discussed with the patient that there may be a patient responsible charge related to this service. The patient expressed understanding and agreed to proceed.  Chief Complaint  Patient presents with  . follow up colitis    Taking entocort 1 tab QD x few weeks and doing okay. Not having any diarrhea.      History of Present Illness:  85 year old gentleman with diarrhea.  Colonoscopy several weeks ago demonstrated colitis involving the entire colon with relative sparing of the sigmoid and the rectum.  Biopsies consistent with mild colitis.  He responded nicely to Entocort -   currently taking 9 mg daily.  He feels good.  He reports bowel function is now normal  - 1 bowel movement daily.  No bleeding. Denies taking any more than 200 mg of ibuprofen daily. Interestingly, he developed acute diarrheal illness a year ago which was found to be related to Campylobacter which resolved until he became ill this past fall.  He reports Dr. Mila Palmer did blood work just recently and was told everything looked good.  He did have a history of mildly elevated aminotransferases in the chart  going back to 2011 without apparent follow-up that I can see in our record..     Past Medical History:  Diagnosis Date  . Arrhythmia   . CAD (coronary artery disease) s/p LAD stent 2003 12/06/2012   LAD stent (bare metal BXVelocity 2.5x18), 2003  . CHF (congestive heart failure) (HCC)   . Heart disease   . High cholesterol   . HOH (hard of hearing)   . Hypercholesterolemia    severe  . Hyperlipidemia 12/06/2012   Statin intolerant - myalgia and LFT elevation, Zetia intolerant -myalgia, Niacin intolerant  . Liver disease   . Myocardial infarction (HCC)   . Visual disorder      Past Surgical History:  Procedure Laterality Date  . CARDIAC CATHETERIZATION     stent place in his LAD artery  . COLONOSCOPY N/A 10/22/2018   Procedure: COLONOSCOPY;  Surgeon: Corbin Ade, MD;  Location: [facility];  Service: Endoscopy;  Laterality: N/A;  10:30am  . ELECTROCARDIOGRAM     normal  . exercise myocardiolite    . Stress Dipyridamole myocardial perfusion  2010     Current Meds  Medication Sig  . aspirin 325 MG tablet Take 325 mg by mouth at bedtime.  . budesonide (ENTOCORT EC) 3 MG 24 hr capsule Take 3 capsules (9 mg total) by mouth daily. (Patient taking differently: Take 3 mg by mouth daily. )  . carboxymethylcellul-glycerin (REFRESH OPTIVE) 0.5-0.9 % ophthalmic solution Place 1 drop into both eyes 3 (three) times daily  as needed for dry eyes.  . Cholecalciferol (VITAMIN D-3) 125 MCG (5000 UT) TABS Take 5,000 Units by mouth daily.   . Coenzyme Q10 (COQ10) 100 MG CAPS Take 100 mg by mouth daily.  . diphenhydramine-acetaminophen (TYLENOL PM) 25-500 MG TABS tablet Take 1 tablet by mouth at bedtime.  . Flaxseed, Linseed, (FLAXSEED OIL MAX STR) 1300 MG CAPS Take 2 tablets by mouth daily.   Marland Kitchen ibuprofen (ADVIL) 200 MG tablet Take 200 mg by mouth every 8 (eight) hours as needed (for pain.).  Marland Kitchen milk thistle 175 MG tablet Take 525 mg by mouth daily.  . Probiotic Product (PROBIOTIC DAILY  PO) Take 1 capsule by mouth daily.   . Red Yeast Rice 600 MG CAPS Take 1,200 mg by mouth daily.   Marland Kitchen trolamine salicylate (ASPERCREME) 10 % cream Apply 1 application topically 4 (four) times daily as needed (arthritis pain.).  Marland Kitchen TURMERIC PO Take 2,000 mg by mouth daily.  Marland Kitchen ZINC-VITAMIN C PO Take 1 tablet by mouth daily.     Family History  Problem Relation Age of Onset  . Stroke Mother   . Stroke Father   . Colon cancer Neg Hx     Social History   Socioeconomic History  . Marital status: Married    Spouse name: Not on file  . Number of children: Not on file  . Years of education: Not on file  . Highest education level: Not on file  Occupational History  . Not on file  Tobacco Use  . Smoking status: Never Smoker  . Smokeless tobacco: Never Used  Substance and Sexual Activity  . Alcohol use: Yes    Comment: occas. wine  . Drug use: No  . Sexual activity: Not on file  Other Topics Concern  . Not on file  Social History Narrative  . Not on file   Social Determinants of Health   Financial Resource Strain:   . Difficulty of Paying Living Expenses: Not on file  Food Insecurity:   . Worried About Programme researcher, broadcasting/film/video in the Last Year: Not on file  . Ran Out of Food in the Last Year: Not on file  Transportation Needs:   . Lack of Transportation (Medical): Not on file  . Lack of Transportation (Non-Medical): Not on file  Physical Activity:   . Days of Exercise per Week: Not on file  . Minutes of Exercise per Session: Not on file  Stress:   . Feeling of Stress : Not on file  Social Connections:   . Frequency of Communication with Friends and Family: Not on file  . Frequency of Social Gatherings with Friends and Family: Not on file  . Attends Religious Services: Not on file  . Active Member of Clubs or Organizations: Not on file  . Attends Banker Meetings: Not on file  . Marital Status: Not on file       Review of Systems: As in history of present  illness   Observations/Objective: No distress. Unable to perform physical exam due to telephone encounter. No video available.   Assessment and Plan: 85 year old gentleman appears to have new onset inflammatory bowel disease.  Endoscopically and histologically with rectal and sigmoid sparing.  TI look good. 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 Relatively minimal use of NSAIDs.  I doubt that is playing a role but as discussed with the patient, NSAIDs can exacerbate inflammatory bowel disease and can cause colitis de novo.  He is very happy with his marked improvement in symptoms.   Follow Up Instructions:Decrease Entocort to 6 mg (2 tablets) daily and continue for the next 6 weeks  Begin Lialda 4 capsules daily (dispense 120 capsules-4 capsules / 4.8 g daily).  11 refills.  Need to retrieve labs from PCPs office to make sure creatinine is normal  Minimize use of ibuprofen in the setting of colitis  Virtual visit in 6 weeks       I discussed the assessment and treatment plan with the patient. The patient was provided an opportunity to ask questions and all were answered. The patient agreed with the plan and demonstrated an understanding of the instructions.   The patient was advised to call back or seek an in-person evaluation if the symptoms worsen or if the condition fails to improve as anticipated.  I provided 10 minutes of non-face-to-face time during this encounter.  R Mayford Knife, MD [facility]

## 2019-01-22 ENCOUNTER — Other Ambulatory Visit: Payer: Self-pay

## 2019-01-22 ENCOUNTER — Telehealth: Payer: Self-pay | Admitting: Internal Medicine

## 2019-01-22 MED ORDER — MESALAMINE 1.2 G PO TBEC: 4.8000 g | DELAYED_RELEASE_TABLET | Freq: Every day | ORAL | 11 refills | Status: DC

## 2019-01-22 MED ORDER — MESALAMINE 1.2 G PO TBEC: 4.8000 g | DELAYED_RELEASE_TABLET | Freq: Every day | ORAL | 4 refills | Status: DC

## 2019-01-22 NOTE — Telephone Encounter (Signed)
Patient stated rmr was supposed to send a prescription in for him Tuesday and they have not heard anything, [facility] [phone]

## 2019-01-22 NOTE — Telephone Encounter (Signed)
Requested labs from PCP 

## 2019-01-22 NOTE — Telephone Encounter (Signed)
Spoke with spouse. I have sent in the RX for pt. Pts next apt was given to spouse.   Darl Pikes- can you check to see if labs were received from pts PCP. RMR wants to review. Spouse asked PCP to send them over.

## 2019-01-22 NOTE — Telephone Encounter (Signed)
Noted  

## 2019-01-23 ENCOUNTER — Telehealth: Payer: Self-pay | Admitting: Cardiovascular Disease

## 2019-01-23 NOTE — Telephone Encounter (Signed)
Lab work has not been received. The patient's wife has been made aware that a call will be placed to have the results faxed over.  Was unable to get through to the PCP office. Will try again later.

## 2019-01-23 NOTE — Telephone Encounter (Signed)
  Wife is calling to make sure that we received the patient's recent lab work results from his PCP. She wants to make sure the office received it before his appt on 01/28/19 with Dr Royann Shivers

## 2019-01-26 NOTE — Telephone Encounter (Signed)
Labs had already been faxed and scanned into media.

## 2019-01-28 ENCOUNTER — Telehealth (INDEPENDENT_AMBULATORY_CARE_PROVIDER_SITE_OTHER): Payer: Medicare Other | Admitting: Cardiovascular Disease

## 2019-01-28 ENCOUNTER — Encounter: Payer: Self-pay | Admitting: Cardiovascular Disease

## 2019-01-28 VITALS — Ht 66.0 in | Wt 128.0 lb

## 2019-01-28 DIAGNOSIS — E7800 Pure hypercholesterolemia, unspecified: Secondary | ICD-10-CM | POA: Diagnosis not present

## 2019-01-28 DIAGNOSIS — I2510 Atherosclerotic heart disease of native coronary artery without angina pectoris: Secondary | ICD-10-CM

## 2019-01-28 MED ORDER — ASPIRIN EC 81 MG PO TBEC: 81.0000 mg | DELAYED_RELEASE_TABLET | Freq: Every day | ORAL | 3 refills | Status: AC

## 2019-01-28 NOTE — Patient Instructions (Signed)
Medication Instructions:  Dr. Royann Shivers recommends Repatha or Praluent (PCSK9). This is an injectable cholesterol medication. This medication will need prior approval with your insurance company, which we will work on. If the medication is not approved initially, we may need to do an appeal with your insurance. We will keep you updated on this process. This medication can be provided at some local pharmacies or be shipped to you from a specialty pharmacy.  We will set you up with an appointment with our pharmacist team for education on this medication.    *If you need a refill on your cardiac medications before your next appointment, please call your pharmacy*  Lab Work: Your provider would like for you to return in 3 months after starting Repatha or Praluent to have the following labs drawn: fasting lipid. You do not need an appointment for the lab. Once in our office lobby there is a podium where you can sign in and ring the doorbell to alert [place] that you are here. The lab is open from 8:00 am to 4:30 pm; closed for lunch from 12:45pm-1:45pm.  If you have labs (blood work) drawn today and your tests are completely normal, you will receive your results only by: Marland Kitchen MyChart Message (if you have MyChart) OR . A paper copy in the mail If you have any lab test that is abnormal or we need to change your treatment, we will call you to review the results.  Testing/Procedures: None ordered  Follow-Up: At [facility], you and your health needs are our priority.  As part of our continuing mission to provide you with exceptional heart care, we have created designated Provider Care Teams.  These Care Teams include your primary Cardiologist (physician) and Advanced Practice Providers (APPs -  Physician Assistants and Nurse Practitioners) who all work together to provide you with the care you need, when you need it.  Your next appointment:   12 month(s)  The format for your next appointment:   In  Person  Provider:   Thurmon Fair, MD

## 2019-01-28 NOTE — Progress Notes (Signed)
Virtual Visit via Video Note   This visit type was conducted due to national recommendations for restrictions regarding the COVID-19 Pandemic (e.g. social distancing) in an effort to limit this patient's exposure and mitigate transmission in our community.  Due to his co-morbid illnesses, this patient is at least at moderate risk for complications without adequate follow up.  This format is felt to be most appropriate for this patient at this time.  All issues noted in this document were discussed and addressed.  A limited physical exam was performed with this format.  Please refer to the patient's chart for his consent to telehealth for [facility].   Date:  01/28/2019   ID:  Manuel Mcdowell, DOB 01/12/1935, MRN [id number]  Patient Location: Home Provider Location: Home  PCP:  Manuel Bellows, DO  Cardiologist:  Dustine Stickler Electrophysiologist:  None   Evaluation Performed:  Follow-Up Visit  Chief Complaint:  CAD  History of Present Illness:    Manuel Mcdowell is a 85 y.o. male with CAD, s/p LAD stent > 15 years ago, without subsequent events or need for revascularization, hypercholesterolemia intolerant to statins, niacin and Zetia.  He has not had any cardiac issues since her last appointment.  He remains physically active, sometimes splitting wood with an ax. The patient specifically denies any chest pain at rest exertion, dyspnea at rest or with exertion, orthopnea, paroxysmal nocturnal dyspnea, syncope, palpitations, focal neurological deficits, intermittent claudication, lower extremity edema, unexplained weight gain, cough, hemoptysis or wheezing.  He does not take beta-blockers due to resting bradycardia.  He denies fatigue dizziness or near syncope.  On the other hand he developed severe diarrhea for a while and was diagnosed with inflammatory colitis.  Symptoms improved dramatically once he started treating this with oral salicylates (Lialda).  The patient does not have symptoms  concerning for COVID-19 infection (fever, chills, cough, or new shortness of breath).    Past Medical History:  Diagnosis Date  . Arrhythmia   . CAD (coronary artery disease) s/p LAD stent 2003 12/06/2012   LAD stent (bare metal BXVelocity 2.5x18), 2003  . CHF (congestive heart failure) (HCC)   . Heart disease   . High cholesterol   . HOH (hard of hearing)   . Hypercholesterolemia    severe  . Hyperlipidemia 12/06/2012   Statin intolerant - myalgia and LFT elevation, Zetia intolerant -myalgia, Niacin intolerant  . Liver disease   . Myocardial infarction (HCC)   . Visual disorder    Past Surgical History:  Procedure Laterality Date  . CARDIAC CATHETERIZATION     stent place in his LAD artery  . COLONOSCOPY N/A 10/22/2018   Procedure: COLONOSCOPY;  Surgeon: Corbin Ade, MD;  Location[facility];  Service: Endoscopy;  Laterality: N/A;  10:30am  . ELECTROCARDIOGRAM     normal  . exercise myocardiolite    . Stress Dipyridamole myocardial perfusion  2010     Current Meds  Medication Sig  . budesonide (ENTOCORT EC) 3 MG 24 hr capsule Take 3 capsules (9 mg total) by mouth daily. (Patient taking differently: Take 3 mg by mouth daily. )  . carboxymethylcellul-glycerin (REFRESH OPTIVE) 0.5-0.9 % ophthalmic solution Place 1 drop into both eyes 3 (three) times daily as needed for dry eyes.  . Cholecalciferol (VITAMIN D-3) 125 MCG (5000 UT) TABS Take 5,000 Units by mouth daily.   . Coenzyme Q10 (COQ10) 100 MG CAPS Take 100 mg by mouth daily.  . diphenhydramine-acetaminophen (TYLENOL PM) 25-500 MG TABS tablet Take  1 tablet by mouth at bedtime.  . Flaxseed, Linseed, (FLAXSEED OIL MAX STR) 1300 MG CAPS Take 2 tablets by mouth daily.   Marland Kitchen ibuprofen (ADVIL) 200 MG tablet Take 200 mg by mouth every 8 (eight) hours as needed (for pain.).  Marland Kitchen mesalamine (LIALDA) 1.2 g EC tablet Take 4 tablets (4.8 g total) by mouth daily with breakfast.  . milk thistle 175 MG tablet Take 525 mg by mouth  daily.  . Probiotic Product (PROBIOTIC DAILY PO) Take 1 capsule by mouth daily.   . Red Yeast Rice 600 MG CAPS Take 1,200 mg by mouth daily.   Marland Kitchen trolamine salicylate (ASPERCREME) 10 % cream Apply 1 application topically 4 (four) times daily as needed (arthritis pain.).  Marland Kitchen TURMERIC PO Take 2,000 mg by mouth daily.  Marland Kitchen ZINC-VITAMIN C PO Take 1 tablet by mouth daily.  . [DISCONTINUED] aspirin 325 MG tablet Take 81 mg by mouth at bedtime.     Allergies:   Statins, Metronidazole, Other, and Sulfa antibiotics   Social History   Tobacco Use  . Smoking status: Never Smoker  . Smokeless tobacco: Never Used  Substance Use Topics  . Alcohol use: Yes    Comment: occas. wine  . Drug use: No     Family Hx: The patient's family history includes Stroke in his father and mother. There is no history of Colon cancer.  ROS:   Please see the history of present illness.    All other systems reviewed and are negative.   Prior CV studies:   The following studies were reviewed today:  Labs ordered by his primary care provider, Dr. Pernell Dupre in [place].  Labs/Other Tests and Data Reviewed:    EKG:  No ECG reviewed.  Recent Labs: No results found for requested labs within last 8760 hours.  01/14/2019 Hemoglobin 14.0, potassium 5.2, glucose 98, creatinine 1.1, normal liver function tests  Recent Lipid Panel No results found for: CHOL, TRIG, HDL, CHOLHDL, LDLCALC, LDLDIRECT   40/98/1191 Total cholesterol 234, HDL 64, triglycerides 139, LDL 147  Wt Readings from Last 3 Encounters:  01/28/19 128 lb (58.1 kg)  10/22/18 134 lb (60.8 kg)  08/25/18 133 lb 12.8 oz (60.7 kg)     Objective:    Vital Signs:  Ht 5\' 6"  (1.676 m)   Wt 128 lb (58.1 kg)   BMI 20.66 kg/m    VITAL SIGNS:  reviewed GEN:  no acute distress EYES:  sclerae anicteric, EOMI - Extraocular Movements Intact RESPIRATORY:  normal respiratory effort, symmetric expansion CARDIOVASCULAR:  no peripheral edema SKIN:  no rash,  lesions or ulcers. MUSCULOSKELETAL:  no obvious deformities. NEURO:  alert and oriented x 3, no obvious focal deficit PSYCH:  normal affect  ASSESSMENT & PLAN:    1. CAD: Quite active physically, but completely asymptomatic.  Encouraged him to remain physically active.  He is quite lean.  Reduce aspirin to 81 mg daily, especially since he is now taking mesalamine.  Intolerant to beta-blockers due to bradycardia. 2. HLP: Strongly recommended PCSK9 inhibitors, which Dr. also recommended.  When we last discussed this, he was worried about the cost, but we reviewed the fact that the medications and which were affordable.  I would recommend either Repatha or Praluent, whichever 1 his insurance company provides better coverage for.  We will set up an appointment with our clinical pharmacist to teach him how to use the delivery device.  Repeat lipid profile in 3 months.  Stop red yeast rice when  he starts the new medication.  COVID-19 Education: The signs and symptoms of COVID-19 were discussed with the patient and how to seek care for testing (follow up with PCP or arrange E-visit).  The importance of social distancing was discussed today.  Time:   Today, I have spent 21 minutes with the patient with telehealth technology discussing the above problems.     Medication AdjustmCAD:ents/Labs and Tests Ordered: Current medicines are reviewed at length with the patient today.  Concerns regarding medicines are outlined above.   Tests Ordered: Orders Placed This Encounter  Procedures  . Lipid panel    Medication Changes: Meds ordered this encounter  Medications  . aspirin EC 81 MG tablet    Sig: Take 1 tablet (81 mg total) by mouth daily.    Dispense:  90 tablet    Refill:  3    Follow Up:  In Person 1 year  Signed, Sanda Klein, MD  01/28/2019 11:21 AM    [place]

## 2019-01-29 ENCOUNTER — Telehealth: Payer: Self-pay

## 2019-01-29 ENCOUNTER — Telehealth: Payer: Medicare Other

## 2019-01-29 MED ORDER — REPATHA SURECLICK 140 MG/ML ~~LOC~~ SOAJ: 140.0000 mg | SUBCUTANEOUS | 11 refills | Status: DC

## 2019-01-29 NOTE — Telephone Encounter (Signed)
Called and notified the pt that the repatha was approved, rx sent and pt voiced understanding

## 2019-02-06 ENCOUNTER — Other Ambulatory Visit: Payer: Self-pay | Admitting: Cardiovascular Disease

## 2019-02-06 MED ORDER — REPATHA SURECLICK 140 MG/ML ~~LOC~~ SOAJ: 140.0000 mg | SUBCUTANEOUS | 3 refills | Status: DC

## 2019-02-06 NOTE — Telephone Encounter (Signed)
*  STAT* If patient is at the pharmacy, call can be transferred to refill team.   1. Which medications need to be refilled? (please list name of each medication and dose if known) Evolocumab (REPATHA SURECLICK) 140 MG/ML SOAJ  2. Which pharmacy/location (including street and city if local pharmacy) is medication to be sent to? [facility] [address] [place], [place]  3. Do they need a 30 day or 90 day supply? 90 day  [facility] so he will need it sent to [facility] instead.

## 2019-02-09 ENCOUNTER — Encounter: Payer: Self-pay | Admitting: Internal Medicine

## 2019-02-10 ENCOUNTER — Encounter: Payer: Self-pay | Admitting: Internal Medicine

## 2019-03-02 ENCOUNTER — Telehealth: Payer: Self-pay | Admitting: Internal Medicine

## 2019-03-02 NOTE — Telephone Encounter (Signed)
Spoke with pt's spouse. Pt is concerned that he is having reactions to his medications. Pt was seen 01/2019 via Doxy apt. Pt is scheduled to have his 2nd Doxy apt with RMR on 03/13/2019. Pt is having diarrhea 4-5 times a day most days with cramping. Today pt hasn't had any loose stools or BM's as of yet. Pt is having some small nose bleeds in the morning when he wakes up, headache, neck pain and abdominal cramping. At pt's last doxy apt 01/2019, pt was asked to decrease Entocort to 2 tabs daily x 6 weeks and Lialda 4 tabs daily. Pt started Repatha injection for his cholesterol a couple weeks ago. Pt wasn't having the symptoms headache, neck pain, nose bleeds prior to using it. Pt's spouse was asked to call his pcp to discuss the injections. Pt wanted to check with our office to see if the medications are working against  each other due to the watery diarrhea pt continues to have. Please advise in absence of RMR.

## 2019-03-02 NOTE — Telephone Encounter (Signed)
Spoke with pt's spouse. Diarrhea started back two weeks ago. Pt hasn't been on any recent antibiotics. Pt states that they to the grocery store only and back home. They don't know of any exposures to anyone sick  unless when they go to the store.  Pt would like to hold off on stool studies at this time if possible. They are aware that the testing would provide positive or negative results of possible infection. They would like to know if there is any food list for colitis that pt could try to see if his stool improves?

## 2019-03-02 NOTE — Telephone Encounter (Addendum)
Reviewed last appt Jan 2021. He did not have diarrhea at that time, as it had resolved. Did well with Entocort 9 mg daily and was tapered down to 6 mg at that visit. Make sure avoiding or at least take Ibuprofen sparingly.   When did diarrhea start? Any recent antibiotics? Any sick contacts? Let's obtain GI path panel and Cdiff. May need to bump up Entocort short-term.

## 2019-03-02 NOTE — Telephone Encounter (Signed)
Please send a low residue diet for now. Avoid dairy, lactose products.

## 2019-03-02 NOTE — Telephone Encounter (Signed)
[phone] PLEASE CALL PATIENT, HE IS HAVING DIARRHEA AND CRAMPING, HEADACHE AND NECK PAIN, NOSE BLEEDING.  HE THINKS HE IS HAVING SIDE EFFECTS FROM HIS MEDICATION

## 2019-03-02 NOTE — Telephone Encounter (Signed)
Spoke with pts spouse. She is aware of diet information that will be mailed to them. Pt should avoid dairy and lactulose at this time as well.

## 2019-03-03 ENCOUNTER — Ambulatory Visit: Payer: Medicare Other | Admitting: Internal Medicine

## 2019-03-10 ENCOUNTER — Ambulatory Visit: Payer: Medicare Other | Admitting: Internal Medicine

## 2019-03-13 ENCOUNTER — Other Ambulatory Visit: Payer: Self-pay

## 2019-03-13 ENCOUNTER — Ambulatory Visit (INDEPENDENT_AMBULATORY_CARE_PROVIDER_SITE_OTHER): Payer: Medicare Other | Admitting: Internal Medicine

## 2019-03-13 ENCOUNTER — Encounter: Payer: Self-pay | Admitting: Internal Medicine

## 2019-03-13 ENCOUNTER — Telehealth: Payer: Self-pay | Admitting: Internal Medicine

## 2019-03-13 DIAGNOSIS — K529 Noninfective gastroenteritis and colitis, unspecified: Secondary | ICD-10-CM

## 2019-03-13 DIAGNOSIS — R7401 Elevation of levels of liver transaminase levels: Secondary | ICD-10-CM

## 2019-03-13 NOTE — Telephone Encounter (Signed)
I called patient before his virtual visit this morning.  Got his answering service.  I was nearly an hour behind in the office.  Apologize for my tardiness.  At this point, I feel Mr. Manuel Mcdowell would be best served by a face-to-face encounter in the office with an extender in the near future.  I would like for him to have a hepatic function profile in the near future just to see where the numbers stand.  History of elevated aminotransferases in the past.

## 2019-03-13 NOTE — Telephone Encounter (Signed)
Spoke with pts spouse. Pt would prefer to see RMR. Pt was scheduled for 03/31/2019 @ 3:30 PM. Pt will have labs done prior to coming in office. They apologized for missing the call. They are having volume issues with their phone.   Pt wanted RMR to know that he d/c Budesonide 1 week ago due to feeling like it caused his fatigue and nose bleeds. When pt d/c, he felt back to normal. Pt states that he is pretty active and he feels differently when he takes the medication. Pt will discuss further at his apt. Pt isn't sure if his blood work was previously elevated due to taking the Budesonide.

## 2019-03-15 NOTE — Telephone Encounter (Signed)
Communication noted.  

## 2019-03-16 NOTE — Telephone Encounter (Signed)
Noted  

## 2019-03-17 NOTE — Telephone Encounter (Signed)
noted 

## 2019-03-25 LAB — HEPATIC FUNCTION PANEL
ALT: 12 IU/L (ref 0–44)
AST: 21 IU/L (ref 0–40)
Albumin: 4.2 g/dL (ref 3.6–4.6)
Alkaline Phosphatase: 68 IU/L (ref 39–117)
Bilirubin Total: 0.8 mg/dL (ref 0.0–1.2)
Bilirubin, Direct: 0.21 mg/dL (ref 0.00–0.40)
Total Protein: 6.3 g/dL (ref 6.0–8.5)

## 2019-03-31 ENCOUNTER — Ambulatory Visit (INDEPENDENT_AMBULATORY_CARE_PROVIDER_SITE_OTHER): Payer: Medicare Other | Admitting: Internal Medicine

## 2019-03-31 ENCOUNTER — Other Ambulatory Visit: Payer: Self-pay

## 2019-03-31 ENCOUNTER — Telehealth: Payer: Self-pay | Admitting: Cardiovascular Disease

## 2019-03-31 ENCOUNTER — Encounter: Payer: Self-pay | Admitting: Internal Medicine

## 2019-03-31 VITALS — BP 122/72 | HR 66 | Temp 97.0°F | Ht 66.0 in | Wt 136.8 lb

## 2019-03-31 DIAGNOSIS — I2510 Atherosclerotic heart disease of native coronary artery without angina pectoris: Secondary | ICD-10-CM

## 2019-03-31 DIAGNOSIS — K529 Noninfective gastroenteritis and colitis, unspecified: Secondary | ICD-10-CM | POA: Diagnosis not present

## 2019-03-31 MED ORDER — BUDESONIDE 3 MG PO CPEP: 6.0000 mg | ORAL_CAPSULE | Freq: Every day | ORAL | 2 refills | Status: DC

## 2019-03-31 NOTE — Telephone Encounter (Signed)
  Pt c/o medication issue:  1. Name of Medication: Evolocumab (REPATHA SURECLICK) 140 MG/ML SOAJ  2. How are you currently taking this medication (dosage and times per day)?   3. Are you having a reaction (difficulty breathing--STAT)?   4. What is your medication issue? Pt's wife called and [facility] need [facility] for Repatha, they said to call [phone] to answer some questions. Also, pt wife ask to call her back once prio Berkley Harvey is made.

## 2019-03-31 NOTE — Telephone Encounter (Signed)
Called and spoke w/pt's spouse clarified that the pa was started last week, however; the insurance company sent in a additional information request form that I filled out and faxed back into them and that we are currently awaiting a determination. The pt's spouse voice understanding and I instructed her to call back once he runs out so that we may provide a sample if need be.

## 2019-03-31 NOTE — Patient Instructions (Signed)
Stop Lialda  Resume Entocort 6 mg daily - Disp 90 - take (2) 3 mg tablets daily.  2 refills.  Minimize use of NSAID medication like Aleve and Advil.  Office visit in 3 months

## 2019-03-31 NOTE — Progress Notes (Signed)
Primary Care Physician:  Jonathon Bellows, DO Primary Gastroenterologist:  Dr. Jena Gauss  Pre-Procedure History & Physical: HPI:  Manuel Mcdowell is a 85 y.o. male here for follow-up of indeterminate colitis.  Started Repatha for hyper cholesterolemia recently.  Also started Lialda with a tapering dose of Entocort.  Patient feels Lialda caused nosebleeds he stopped it.  He ended up coming off of Entocort as well.  He has 1-3 bowel movements daily says he needs much better than he was when he initially presented to our office.  But not quite to his baseline.  Is not bleeding.  Using any nonsteroidal agents.  Past Medical History:  Diagnosis Date  . Arrhythmia   . CAD (coronary artery disease) s/p LAD stent 2003 12/06/2012   LAD stent (bare metal BXVelocity 2.5x18), 2003  . CHF (congestive heart failure) (HCC)   . Heart disease   . High cholesterol   . HOH (hard of hearing)   . Hypercholesterolemia    severe  . Hyperlipidemia 12/06/2012   Statin intolerant - myalgia and LFT elevation, Zetia intolerant -myalgia, Niacin intolerant  . Liver disease   . Myocardial infarction (HCC)   . Visual disorder     Past Surgical History:  Procedure Laterality Date  . CARDIAC CATHETERIZATION     stent place in his LAD artery  . COLONOSCOPY N/A 10/22/2018   Procedure: COLONOSCOPY;  Surgeon: Corbin Ade, MD;  Location: [facility];  Service: Endoscopy;  Laterality: N/A;  10:30am  . ELECTROCARDIOGRAM     normal  . exercise myocardiolite    . Stress Dipyridamole myocardial perfusion  2010    Prior to Admission medications   Medication Sig Start Date End Date Taking? Authorizing Provider  aspirin EC 81 MG tablet Take 1 tablet (81 mg total) by mouth daily. 01/28/19  Yes Croitoru, Mihai, MD  carboxymethylcellul-glycerin (REFRESH OPTIVE) 0.5-0.9 % ophthalmic solution Place 1 drop into both eyes 3 (three) times daily as needed for dry eyes.   Yes [provider]  Cholecalciferol (VITAMIN D-3)  125 MCG (5000 UT) TABS Take 5,000 Units by mouth daily.    Yes [provider]  Coenzyme Q10 (COQ10) 100 MG CAPS Take 100 mg by mouth daily.   Yes [provider]  diphenhydramine-acetaminophen (TYLENOL PM) 25-500 MG TABS tablet Take 1 tablet by mouth at bedtime.   Yes [provider]  Evolocumab (REPATHA SURECLICK) 140 MG/ML SOAJ Inject 140 mg into the skin every 14 (fourteen) days. 02/06/19  Yes Croitoru, Mihai, MD  Flaxseed, Linseed, (FLAXSEED OIL MAX STR) 1300 MG CAPS Take 2 tablets by mouth daily.    Yes [provider]  ibuprofen (ADVIL) 200 MG tablet Take 200 mg by mouth as needed (for pain.).    Yes [provider]  milk thistle 175 MG tablet Take 525 mg by mouth daily.   Yes [provider]  Probiotic Product (PROBIOTIC DAILY PO) Take 1 capsule by mouth daily.    Yes [provider]  Red Yeast Rice 600 MG CAPS Take 1,200 mg by mouth daily.    Yes [provider]  trolamine salicylate (ASPERCREME) 10 % cream Apply 1 application topically at bedtime.    Yes [provider]  TURMERIC PO Take 2,000 mg by mouth daily.   Yes [provider]  ZINC-VITAMIN C PO Take 1 tablet by mouth daily.   Yes [provider]  budesonide (ENTOCORT EC) 3 MG 24 hr capsule Take 3 capsules (9 mg  total) by mouth daily. Patient not taking: Reported on 03/13/2019 12/18/18   Daneil Dolin, MD  mesalamine (LIALDA) 1.2 g EC tablet Take 4 tablets (4.8 g total) by mouth daily with breakfast. Patient not taking: Reported on 03/13/2019 01/22/19   Daneil Dolin, MD    Allergies as of 03/31/2019 - Review Complete 03/31/2019  Allergen Reaction Noted  . Statins Anaphylaxis 12/02/2012  . Metronidazole  03/19/2018  . Other  10/21/2018  . Sulfa antibiotics Hives 12/02/2012    Family History  Problem Relation Age of Onset  . Stroke Mother   . Stroke Father   . Colon cancer Neg Hx     Social History   Socioeconomic  History  . Marital status: Married    Spouse name: Not on file  . Number of children: Not on file  . Years of education: Not on file  . Highest education level: Not on file  Occupational History  . Not on file  Tobacco Use  . Smoking status: Never Smoker  . Smokeless tobacco: Never Used  Substance and Sexual Activity  . Alcohol use: Yes    Comment: occas. wine  . Drug use: No  . Sexual activity: Not on file  Other Topics Concern  . Not on file  Social History Narrative  . Not on file   Social Determinants of Health   Financial Resource Strain:   . Difficulty of Paying Living Expenses:   Food Insecurity:   . Worried About Charity fundraiser in the Last Year:   . Arboriculturist in the Last Year:   Transportation Needs:   . Film/video editor (Medical):   Marland Kitchen Lack of Transportation (Non-Medical):   Physical Activity:   . Days of Exercise per Week:   . Minutes of Exercise per Session:   Stress:   . Feeling of Stress :   Social Connections:   . Frequency of Communication with Friends and Family:   . Frequency of Social Gatherings with Friends and Family:   . Attends Religious Services:   . Active Member of Clubs or Organizations:   . Attends Archivist Meetings:   Marland Kitchen Marital Status:   Intimate Partner Violence:   . Fear of Current or Ex-Partner:   . Emotionally Abused:   Marland Kitchen Physically Abused:   . Sexually Abused:     Review of Systems: See HPI, otherwise negative ROS  Physical Exam: BP 122/72   Pulse 66   Temp (!) 97 F (36.1 C) (Oral)   Ht 5\' 6"  (1.676 m)   Wt 136 lb 12.8 oz (62.1 kg)   BMI 22.08 kg/m  General:   Alert,  Well-developed, well-nourished, pleasant and cooperative in NAD Abdomen: Non-distended, normal bowel sounds.  Soft and nontender without appreciable mass or hepatosplenomegaly.  Pulses:  Normal pulses noted. Extremities:  Without clubbing or edema.   Impression:   Pleasant 85 year old gent with indeterminate colitis.  He  has pain in partial remission on a brief course of budesonide and mesalamine.  I feel that he needs a more protracted dose of steroid therapy with a slower taper in hopes we can do gain a remission without chronic anti-inflammatory (mesalamine) therapy.  Recommendations: Stop Lialda  Resume Entocort 6 mg daily - Disp 90 - take (2) 3 mg tablets daily.  2 refills.  Minimize use of NSAID medication like Aleve and Advil.  Office visit in 3 months   Notice: This dictation was prepared with Dragon dictation  along with smaller phrase technology. Any transcriptional errors that result from this process are unintentional and may not be corrected upon review.

## 2019-04-01 ENCOUNTER — Encounter: Payer: Self-pay | Admitting: Internal Medicine

## 2019-04-01 ENCOUNTER — Other Ambulatory Visit: Payer: Self-pay

## 2019-04-01 DIAGNOSIS — E7800 Pure hypercholesterolemia, unspecified: Secondary | ICD-10-CM

## 2019-04-07 ENCOUNTER — Encounter: Payer: Self-pay | Admitting: *Deleted

## 2019-04-07 LAB — LIPID PANEL
Chol/HDL Ratio: 2.4 ratio (ref 0.0–5.0)
Cholesterol, Total: 134 mg/dL (ref 100–199)
HDL: 56 mg/dL (ref >39)
LDL Chol Calc (NIH): 54 mg/dL (ref 0–99)
Triglycerides: 139 mg/dL (ref 0–149)
VLDL Cholesterol Cal: 24 mg/dL (ref 5–40)

## 2019-04-08 NOTE — Telephone Encounter (Signed)
Called and spoke w/pt regarding receiving the lipid panel to appeal the denial for the repatha. Appeal sent 04/08/19

## 2019-04-23 ENCOUNTER — Telehealth: Payer: Self-pay

## 2019-04-23 NOTE — Telephone Encounter (Signed)
lmomed the pt that the repatha was approved and to call us back if issues arise

## 2019-07-15 ENCOUNTER — Ambulatory Visit (INDEPENDENT_AMBULATORY_CARE_PROVIDER_SITE_OTHER): Payer: Medicare Other | Admitting: Nurse Practitioner

## 2019-07-15 ENCOUNTER — Other Ambulatory Visit: Payer: Self-pay

## 2019-07-15 ENCOUNTER — Encounter: Payer: Self-pay | Admitting: Nurse Practitioner

## 2019-07-15 VITALS — BP 126/66 | HR 69 | Temp 97.5°F | Ht 66.0 in | Wt 133.4 lb

## 2019-07-15 DIAGNOSIS — I2510 Atherosclerotic heart disease of native coronary artery without angina pectoris: Secondary | ICD-10-CM

## 2019-07-15 DIAGNOSIS — K529 Noninfective gastroenteritis and colitis, unspecified: Secondary | ICD-10-CM

## 2019-07-15 DIAGNOSIS — R197 Diarrhea, unspecified: Secondary | ICD-10-CM

## 2019-07-15 DIAGNOSIS — R634 Abnormal weight loss: Secondary | ICD-10-CM

## 2019-07-15 NOTE — Patient Instructions (Signed)
Your health issues we discussed today were:   Colitis (inflammation in your colon): 1. I am glad you are doing better on Entocort 6 mg (two 3mg  tablets once a day) 2. As we discussed, we will reduce your dose to a single 3 mg tablet once a day 3. Call if you run short of medication and need a refill to get you through the next 3 months 4. Call us if you have any worsening or recurrent symptoms 5. If you do well on this dosing we can consider backing off even further or even stopping the medication  Overall I recommend:  1. Continue your other current medications 2. Return for follow-up in 3 months 3. Call us if you have any questions or concerns   ---------------------------------------------------------------  I am glad you have gotten your COVID-19 vaccination!  Even though you are fully vaccinated you should continue to follow CDC and state/local guidelines.  ---------------------------------------------------------------   At [facility] we value your feedback. You may receive a survey about your visit today. Please share your experience as we strive to create trusting relationships with our patients to provide genuine, compassionate, quality care.  We appreciate your understanding and patience as we review any laboratory studies, imaging, and other diagnostic tests that are ordered as we care for you. Our office policy is 5 business days for review of these results, and any emergent or urgent results are addressed in a timely manner for your best interest. If you do not hear from our office in 1 week, please contact [facility].   We also encourage the use of MyChart, which contains your medical information for your review as well. If you are not enrolled in this feature, an access code is on this after visit summary for your convenience. Thank you for allowing [place] to be involved in your care.  It was great to see you today!  I hope you have a great Summer!!

## 2019-07-15 NOTE — Assessment & Plan Note (Signed)
Diarrhea has significantly improved and essentially resolved on Entocort.  Further recommendations as per above.  Follow-up in 3 months.

## 2019-07-15 NOTE — Assessment & Plan Note (Signed)
Previously with complaints of weight loss.  It appears his weight has stabilized now.  This is likely going to remain stable or improve given that he has no longer having diarrhea.  Continue to monitor weight, follow-up in 3 months.

## 2019-07-15 NOTE — Assessment & Plan Note (Signed)
Previous diagnosis of colitis as per HPI there was not doing well on the aloe.  The patient was restarted on Entocort 6 mg daily for 3 months.  He has returned to baseline and is very satisfied with his results.  Having once daily bowel movement that is soft and passes easily.  At this point being it is been 3 months we will try to reduce his dose to 3 mg a day.  I have asked him to call us if he needs a further refill to get him through the end of the 3 months.  He is to call us if he has any recurrent symptoms or worsening.  If he does well on this for 90 days we will see him in the office and plan to stop the Entocort and see how he does off of medication.  There is a possibility he may need to be on a low-dose such as 3 mg every other day indefinitely depending on his response.  Follow-up in 3 months.

## 2019-07-15 NOTE — Progress Notes (Signed)
Referring Provider: Jonathon Bellows, DO Primary Care Physician:  Jonathon Bellows, DO Primary GI:  Dr. Jena Gauss   Chief Complaint  Patient presents with  . colitis    doing ok    HPI:   Manuel Mcdowell is a 85 y.o. male who presents for follow-up.  Patient was last seen in our office 03/31/2019 for colitis.  Previously diagnosed with indeterminate colitis, recently started Repatha for hypercholesterolemia.  Started Lialda with a tapering dose of Entocort and feels likely other cause nosebleeds so he stopped it.  He ended up coming off Entocort as well.  Having 1-3 bowel movements daily that he needs much better than when he initially presented but not quite to baseline.  No bleeding or NSAIDs.  Recommended stop Lialda, resume Entocort 6 mg daily with 2 refills, minimize use of NSAIDs, follow-up in 3 months.  Today he states he's doing well. He's back to baseline. Having one stool a day, consistent with [place] 4. Denies abdominal pain, N/V, hematochezia, melena, fever, chills, unintentional weight loss. Denies URI or flu-like symptoms. Denies loss of sense of taste or smell. The patient has received COVID-19 vaccination(s). Denies chest pain, dyspnea, dizziness, lightheadedness, syncope, near syncope. Denies any other upper or lower GI symptoms.  Takes ASA 81 mg. No other NSAIDs.  Past Medical History:  Diagnosis Date  . Arrhythmia   . CAD (coronary artery disease) s/p LAD stent 2003 12/06/2012   LAD stent (bare metal BXVelocity 2.5x18), 2003  . CHF (congestive heart failure) (HCC)   . Heart disease   . High cholesterol   . HOH (hard of hearing)   . Hypercholesterolemia    severe  . Hyperlipidemia 12/06/2012   Statin intolerant - myalgia and LFT elevation, Zetia intolerant -myalgia, Niacin intolerant  . Liver disease   . Myocardial infarction (HCC)   . Visual disorder     Past Surgical History:  Procedure Laterality Date  . CARDIAC CATHETERIZATION     stent place in his LAD artery    . COLONOSCOPY N/A 10/22/2018   Procedure: COLONOSCOPY;  Surgeon: Corbin Ade, MD;  Location: [facility];  Service: Endoscopy;  Laterality: N/A;  10:30am  . ELECTROCARDIOGRAM     normal  . exercise myocardiolite    . Stress Dipyridamole myocardial perfusion  2010    Current Outpatient Medications  Medication Sig Dispense Refill  . aspirin EC 81 MG tablet Take 1 tablet (81 mg total) by mouth daily. 90 tablet 3  . budesonide (ENTOCORT EC) 3 MG 24 hr capsule Take 2 capsules (6 mg total) by mouth daily. 90 capsule 2  . carboxymethylcellul-glycerin (REFRESH OPTIVE) 0.5-0.9 % ophthalmic solution Place 1 drop into both eyes 3 (three) times daily as needed for dry eyes.    . Cholecalciferol (VITAMIN D-3) 125 MCG (5000 UT) TABS Take 5,000 Units by mouth daily.     . Coenzyme Q10 (COQ10) 100 MG CAPS Take 100 mg by mouth daily.    . diphenhydramine-acetaminophen (TYLENOL PM) 25-500 MG TABS tablet Take 1 tablet by mouth at bedtime.    . Evolocumab (REPATHA SURECLICK) 140 MG/ML SOAJ Inject 140 mg into the skin every 14 (fourteen) days. 6 mL 3  . Flaxseed, Linseed, (FLAXSEED OIL MAX STR) 1300 MG CAPS Take 2 tablets by mouth daily.     . milk thistle 175 MG tablet Take 525 mg by mouth daily.    . Probiotic Product (PROBIOTIC DAILY PO) Take 1 capsule by mouth daily.     Marland Kitchen  Red Yeast Rice 600 MG CAPS Take 1,200 mg by mouth daily.     Marland Kitchen trolamine salicylate (ASPERCREME) 10 % cream Apply 1 application topically at bedtime.     . TURMERIC PO Take 2,000 mg by mouth daily.    Marland Kitchen ZINC-VITAMIN C PO Take 1 tablet by mouth daily.     No current facility-administered medications for this visit.    Allergies as of 07/15/2019 - Review Complete 07/15/2019  Allergen Reaction Noted  . Statins Anaphylaxis 12/02/2012  . Metronidazole  03/19/2018  . Other  10/21/2018  . Sulfa antibiotics Hives 12/02/2012    Family History  Problem Relation Age of Onset  . Stroke Mother   . Stroke Father   . Colon cancer  Neg Hx     Social History   Socioeconomic History  . Marital status: Married    Spouse name: Not on file  . Number of children: Not on file  . Years of education: Not on file  . Highest education level: Not on file  Occupational History  . Not on file  Tobacco Use  . Smoking status: Never Smoker  . Smokeless tobacco: Never Used  Vaping Use  . Vaping Use: Never used  Substance and Sexual Activity  . Alcohol use: Yes    Comment: occas. wine  . Drug use: No  . Sexual activity: Not on file  Other Topics Concern  . Not on file  Social History Narrative  . Not on file   Social Determinants of Health   Financial Resource Strain:   . Difficulty of Paying Living Expenses:   Food Insecurity:   . Worried About Programme researcher, broadcasting/film/video in the Last Year:   . Barista in the Last Year:   Transportation Needs:   . Freight forwarder (Medical):   Marland Kitchen Lack of Transportation (Non-Medical):   Physical Activity:   . Days of Exercise per Week:   . Minutes of Exercise per Session:   Stress:   . Feeling of Stress :   Social Connections:   . Frequency of Communication with Friends and Family:   . Frequency of Social Gatherings with Friends and Family:   . Attends Religious Services:   . Active Member of Clubs or Organizations:   . Attends Banker Meetings:   Marland Kitchen Marital Status:     Subjective: Review of Systems  Constitutional: Negative for chills, fever, malaise/fatigue and weight loss.  HENT: Negative for congestion and sore throat.   Respiratory: Negative for cough and shortness of breath.   Cardiovascular: Negative for chest pain and palpitations.  Gastrointestinal: Negative for abdominal pain, blood in stool, diarrhea, melena, nausea and vomiting.  Musculoskeletal: Negative for joint pain and myalgias.  Skin: Negative for rash.  Neurological: Negative for dizziness and weakness.  Endo/Heme/Allergies: Does not bruise/bleed easily.  Psychiatric/Behavioral:  Negative for depression. The patient is not nervous/anxious.   All other systems reviewed and are negative.    Objective: BP 126/66   Pulse 69   Temp (!) 97.5 F (36.4 C) (Oral)   Ht 5\' 6"  (1.676 m)   Wt 133 lb 6.4 oz (60.5 kg)   BMI 21.53 kg/m  Physical Exam Vitals and nursing note reviewed.  Constitutional:      General: He is not in acute distress.    Appearance: Normal appearance. He is normal weight. He is not ill-appearing, toxic-appearing or diaphoretic.  HENT:     Head: Normocephalic and atraumatic.  Nose: No congestion or rhinorrhea.  Eyes:     General: No scleral icterus. Cardiovascular:     Rate and Rhythm: Normal rate and regular rhythm.     Heart sounds: Normal heart sounds.  Pulmonary:     Effort: Pulmonary effort is normal.     Breath sounds: Normal breath sounds.  Abdominal:     General: Bowel sounds are normal. There is no distension.     Palpations: Abdomen is soft. There is no hepatomegaly, splenomegaly or mass.     Tenderness: There is no abdominal tenderness. There is no guarding or rebound.     Hernia: No hernia is present.  Musculoskeletal:     Cervical back: Neck supple.  Skin:    General: Skin is warm and dry.     Coloration: Skin is not jaundiced.     Findings: No bruising or rash.  Neurological:     General: No focal deficit present.     Mental Status: He is alert and oriented to person, place, and time. Mental status is at baseline.  Psychiatric:        Mood and Affect: Mood normal.        Behavior: Behavior normal.        Thought Content: Thought content normal.       07/15/2019 1:39 PM   Disclaimer: This note was dictated with voice recognition software. Similar sounding words can inadvertently be transcribed and may not be corrected upon review.

## 2019-10-12 ENCOUNTER — Telehealth: Payer: Self-pay | Admitting: Internal Medicine

## 2019-10-12 NOTE — Telephone Encounter (Signed)
Lmom, waiting on a return call.  

## 2019-10-12 NOTE — Telephone Encounter (Signed)
[facility]

## 2019-10-12 NOTE — Telephone Encounter (Signed)
Spoke with pts spouse. Pt is taking Entocort 1 tab daily per Wynne Dust NP, request at pts last ov 06/2019. Pt was advised to call back for refills. [facility]

## 2019-10-13 MED ORDER — BUDESONIDE 3 MG PO CPEP: 3.0000 mg | ORAL_CAPSULE | Freq: Every day | ORAL | 0 refills | Status: DC

## 2019-10-13 NOTE — Addendum Note (Signed)
Addended by: Tiffany Kocher on: 10/13/2019 01:44 PM   Modules accepted: Orders

## 2019-10-13 NOTE — Telephone Encounter (Signed)
Patient was supposed to be seen this month to determine future needs of entocort but he cancelled ov. Now scheduled for 12/2019.   I will send in one refill to hold him until his ov in 12/2019.

## 2019-10-13 NOTE — Telephone Encounter (Signed)
Noted. Pts spouse is aware that RX was sent to pts pharmacy.

## 2019-10-15 ENCOUNTER — Ambulatory Visit: Payer: Medicare Other | Admitting: Nurse Practitioner

## 2019-11-13 ENCOUNTER — Telehealth: Payer: Self-pay | Admitting: Cardiovascular Disease

## 2019-11-13 MED ORDER — REPATHA SURECLICK 140 MG/ML ~~LOC~~ SOAJ: 140.0000 mg | SUBCUTANEOUS | 3 refills | Status: DC

## 2019-11-13 NOTE — Telephone Encounter (Signed)
Spoke with the patient's wife. She was calling about herself and the patient since they both see Dr. Royann Shivers. She has been made aware that per the last office visit, which was virtual, that Dr. Royann Shivers wanted to see them in person at the next visit.   She has been advised that we can move the appointment to February/ March and to call back if they feel they need to be seen sooner.  The patient needs a refill on Repatha. Will send to pharmd to see if he needs lab work.

## 2019-11-13 NOTE — Telephone Encounter (Signed)
Patient due for a yearly f/u in January. Per wife, they are uncomfortable about the current situation with the virus and she wants to know if virtual will be okay, please advise. I did not see any virtual days, it looks all in office.

## 2019-11-13 NOTE — Telephone Encounter (Signed)
Refill has been sent.  °

## 2019-11-13 NOTE — Telephone Encounter (Signed)
Yes, can refill for 6 months

## 2019-12-16 ENCOUNTER — Other Ambulatory Visit: Payer: Self-pay

## 2019-12-16 ENCOUNTER — Ambulatory Visit (INDEPENDENT_AMBULATORY_CARE_PROVIDER_SITE_OTHER): Payer: Medicare Other | Admitting: Nurse Practitioner

## 2019-12-16 ENCOUNTER — Encounter: Payer: Self-pay | Admitting: Nurse Practitioner

## 2019-12-16 VITALS — BP 119/66 | HR 54 | Temp 96.8°F | Ht 66.0 in | Wt 137.4 lb

## 2019-12-16 DIAGNOSIS — K529 Noninfective gastroenteritis and colitis, unspecified: Secondary | ICD-10-CM | POA: Diagnosis not present

## 2019-12-16 DIAGNOSIS — I2510 Atherosclerotic heart disease of native coronary artery without angina pectoris: Secondary | ICD-10-CM

## 2019-12-16 DIAGNOSIS — R197 Diarrhea, unspecified: Secondary | ICD-10-CM

## 2019-12-16 NOTE — Patient Instructions (Addendum)
Your health issues we discussed today were:   Colitis (inflammation of your colon) resulting in diarrhea: 1. I am glad you are doing better! 2. As we discussed, stop taking Entocort for now 3. Keep your medication on hand in case you needed in the future 4. If you have recurrent diarrhea when you stop Entocort, restart 1 pill once a day and call us to let us know 5. Let us know if you have any worsening or severe symptoms  Overall I recommend:  1. Continue your other current medications 2. Return for follow-up in 6 months 3. Call us for any questions or concerns   ---------------------------------------------------------------  I am glad you have gotten your COVID-19 vaccination!  Even though you are fully vaccinated you should continue to follow CDC and state/local guidelines.  ---------------------------------------------------------------   At [facility] we value your feedback. You may receive a survey about your visit today. Please share your experience as we strive to create trusting relationships with our patients to provide genuine, compassionate, quality care.  We appreciate your understanding and patience as we review any laboratory studies, imaging, and other diagnostic tests that are ordered as we care for you. Our office policy is 5 business days for review of these results, and any emergent or urgent results are addressed in a timely manner for your best interest. If you do not hear from our office in 1 week, please contact us.   We also encourage the use of MyChart, which contains your medical information for your review as well. If you are not enrolled in this feature, an access code is on this after visit summary for your convenience. Thank you for allowing [place] to be involved in your care.  It was great to see you today!  I hope you have a Merry Christmas and [address]!!

## 2019-12-16 NOTE — Progress Notes (Signed)
Referring Provider: Jonathon Bellows, DO Primary Care Physician:  Jonathon Bellows, DO Primary GI:  Dr. Jena Gauss  Chief Complaint  Patient presents with  . colitis    doing ok    HPI:   Manuel Mcdowell is a 85 y.o. male who presents for follow-up on colitis and diarrhea.  The patient was last seen in our office 07/15/2019 for the same as well as weight loss.  Previously diagnosed with indeterminate colitis started on Repatha for hypercholesterolemia.  He was started on Lialda with a tapering dose of Entocort although he stopped this because he felt it was causing nosebleeds.  He was still previously having frequent bowel movements so recommended he stop the Lialda and resume Entocort 6 mg daily with 2 refills and minimize use of NSAIDs.  At his last visit he was back to baseline with 1 stool day consistently [facility] 4 and no other overt GI complaints.  On aspirin 81 mg daily but avoids other NSAIDs.  Recommended dose reduction of Entocort to 3 mg tablet once a day for the next 3 months.  Call for any worsening symptoms.  If he continues to do well can consider backing off even further or even stopping the medication.  Follow-up in 3 months to further address.  He called for refill in September although unfortunately he had previously canceled his September follow-up visit.  Refill was sent to last him until his follow-up visit today.  Today he states doing okay overall. He is not having any more loose stools, still on Entocort 3 mg daily. No abdominal pain. Denies hematochezia, melena, fever, chills, unintentional weight loss. Denies URI or flu-like symptoms. Denies loss of sense of taste or smell. The patient has received COVID-19 vaccination(s). Denies chest pain, dyspnea, dizziness, lightheadedness, syncope, near syncope. Denies any other upper or lower GI symptoms.  Past Medical History:  Diagnosis Date  . Arrhythmia   . CAD (coronary artery disease) s/p LAD stent 2003 12/06/2012   LAD stent  (bare metal BXVelocity 2.5x18), 2003  . CHF (congestive heart failure) (HCC)   . Heart disease   . High cholesterol   . HOH (hard of hearing)   . Hypercholesterolemia    severe  . Hyperlipidemia 12/06/2012   Statin intolerant - myalgia and LFT elevation, Zetia intolerant -myalgia, Niacin intolerant  . Liver disease   . Myocardial infarction (HCC)   . Visual disorder     Past Surgical History:  Procedure Laterality Date  . CARDIAC CATHETERIZATION     stent place in his LAD artery  . COLONOSCOPY N/A 10/22/2018   Procedure: COLONOSCOPY;  Surgeon: Corbin Ade, MD;  [facility];  Service: Endoscopy;  Laterality: N/A;  10:30am  . ELECTROCARDIOGRAM     normal  . exercise myocardiolite    . Stress Dipyridamole myocardial perfusion  2010    Current Outpatient Medications  Medication Sig Dispense Refill  . aspirin EC 81 MG tablet Take 1 tablet (81 mg total) by mouth daily. 90 tablet 3  . budesonide (ENTOCORT EC) 3 MG 24 hr capsule Take 1 capsule (3 mg total) by mouth daily. 90 capsule 0  . carboxymethylcellul-glycerin (REFRESH OPTIVE) 0.5-0.9 % ophthalmic solution Place 1 drop into both eyes 3 (three) times daily as needed for dry eyes.    . Cholecalciferol (VITAMIN D-3) 125 MCG (5000 UT) TABS Take 5,000 Units by mouth daily.     . Coenzyme Q10 (COQ10) 100 MG CAPS Take 100 mg by mouth daily.    Marland Kitchen  diphenhydramine-acetaminophen (TYLENOL PM) 25-500 MG TABS tablet Take 1 tablet by mouth at bedtime.    . Evolocumab (REPATHA SURECLICK) 140 MG/ML SOAJ Inject 140 mg into the skin every 14 (fourteen) days. 6 mL 3  . Flaxseed, Linseed, (FLAXSEED OIL MAX STR) 1300 MG CAPS Take 2 tablets by mouth daily.     . milk thistle 175 MG tablet Take 525 mg by mouth daily.    . Probiotic Product (PROBIOTIC DAILY PO) Take 1 capsule by mouth daily.     Marland Kitchen trolamine salicylate (ASPERCREME) 10 % cream Apply 1 application topically at bedtime.     . TURMERIC PO Take 2,000 mg by mouth daily.    Marland Kitchen  ZINC-VITAMIN C PO Take 1 tablet by mouth daily.     No current facility-administered medications for this visit.    Allergies as of 12/16/2019 - Review Complete 12/16/2019  Allergen Reaction Noted  . Statins Anaphylaxis 12/02/2012  . Metronidazole  03/19/2018  . Other  10/21/2018  . Sulfa antibiotics Hives 12/02/2012    Family History  Problem Relation Age of Onset  . Stroke Mother   . Stroke Father   . Colon cancer Neg Hx     Social History   Socioeconomic History  . Marital status: Married    Spouse name: Not on file  . Number of children: Not on file  . Years of education: Not on file  . Highest education level: Not on file  Occupational History  . Not on file  Tobacco Use  . Smoking status: Never Smoker  . Smokeless tobacco: Never Used  Vaping Use  . Vaping Use: Never used  Substance and Sexual Activity  . Alcohol use: Yes    Comment: occas. wine  . Drug use: No  . Sexual activity: Not on file  Other Topics Concern  . Not on file  Social History Narrative  . Not on file   Social Determinants of Health   Financial Resource Strain:   . Difficulty of Paying Living Expenses: Not on file  Food Insecurity:   . Worried About Programme researcher, broadcasting/film/video in the Last Year: Not on file  . Ran Out of Food in the Last Year: Not on file  Transportation Needs:   . Lack of Transportation (Medical): Not on file  . Lack of Transportation (Non-Medical): Not on file  Physical Activity:   . Days of Exercise per Week: Not on file  . Minutes of Exercise per Session: Not on file  Stress:   . Feeling of Stress : Not on file  Social Connections:   . Frequency of Communication with Friends and Family: Not on file  . Frequency of Social Gatherings with Friends and Family: Not on file  . Attends Religious Services: Not on file  . Active Member of Clubs or Organizations: Not on file  . Attends Banker Meetings: Not on file  . Marital Status: Not on file     Subjective: Review of Systems  Constitutional: Negative for chills, fever, malaise/fatigue and weight loss.  HENT: Negative for congestion and sore throat.   Respiratory: Negative for cough and shortness of breath.   Cardiovascular: Negative for chest pain and palpitations.  Gastrointestinal: Negative for abdominal pain, blood in stool, diarrhea, melena, nausea and vomiting.  Musculoskeletal: Negative for joint pain and myalgias.  Skin: Negative for rash.  Neurological: Negative for dizziness and weakness.  Endo/Heme/Allergies: Does not bruise/bleed easily.  Psychiatric/Behavioral: Negative for depression. The patient is not nervous/anxious.  All other systems reviewed and are negative.    Objective: BP 119/66   Pulse (!) 54   Temp (!) 96.8 F (36 C) (Temporal)   Ht 5\' 6"  (1.676 m)   Wt 137 lb 6.4 oz (62.3 kg)   BMI 22.18 kg/m  Physical Exam Vitals and nursing note reviewed.  Constitutional:      General: He is not in acute distress.    Appearance: Normal appearance. He is normal weight. He is not ill-appearing, toxic-appearing or diaphoretic.  HENT:     Head: Normocephalic and atraumatic.     Nose: No congestion or rhinorrhea.  Eyes:     General: No scleral icterus. Cardiovascular:     Rate and Rhythm: Normal rate and regular rhythm.     Heart sounds: Normal heart sounds.  Pulmonary:     Effort: Pulmonary effort is normal.     Breath sounds: Normal breath sounds.  Abdominal:     General: Bowel sounds are normal. There is no distension.     Palpations: Abdomen is soft. There is no hepatomegaly, splenomegaly or mass.     Tenderness: There is no abdominal tenderness. There is no guarding or rebound.     Hernia: No hernia is present.  Musculoskeletal:     Cervical back: Neck supple.  Skin:    General: Skin is warm and dry.     Coloration: Skin is not jaundiced.     Findings: No bruising or rash.  Neurological:     General: No focal deficit present.     Mental  Status: He is alert and oriented to person, place, and time. Mental status is at baseline.  Psychiatric:        Mood and Affect: Mood normal.        Behavior: Behavior normal.        Thought Content: Thought content normal.      Assessment:  Very pleasant 85 year old male who presents for follow-up on colitis and diarrhea.  Previously documented with indeterminate colitis on biopsy during colonoscopy.  He was started on Entocort 9 mg with a taper down to 3 mg.  He had a great response to the medication.  He is currently on 3 mg once a day.  When we last saw him he was on 6 mg once a day.  He has not had any loose stools since he started his medication.  At this point I recommended that he stop taking Entocort but keep the remaining medicine he has on hand.  If he has recurrence of the symptoms I have told him to restart Entocort 3 mg daily and call our office.  If he has a recurrence we can consider a low-dose maintenance such as 3 mg every 2 to 3 days.   Plan: 1. Stop Entocort 2. Call if recurrent symptoms 3. If symptoms recur restart Entocort 3 mg daily 4. Further recommendations to follow if he has recurrence 5. Follow-up in 6 months otherwise    Thank you for allowing 91 to participate in the care of [facility]  [facility], DNP, AGNP-C Adult & Gerontological Nurse Practitioner [facility]   12/16/2019 11:57 AM   Disclaimer: This note was dictated with voice recognition software. Similar sounding words can inadvertently be transcribed and may not be corrected upon review.

## 2019-12-16 NOTE — Progress Notes (Signed)
Cc'ed to pcp °

## 2020-01-01 ENCOUNTER — Telehealth: Payer: Self-pay | Admitting: Cardiovascular Disease

## 2020-01-01 MED ORDER — REPATHA SURECLICK 140 MG/ML ~~LOC~~ SOAJ: 140.0000 mg | SUBCUTANEOUS | 3 refills | Status: DC

## 2020-01-01 NOTE — Telephone Encounter (Signed)
*  STAT* If patient is at the pharmacy, call can be transferred to refill team.   1. Which medications need to be refilled? (please list name of each medication and dose if known) * new prescription- changing pharmacy for Repatha  2. Which pharmacy/location (including street and city if local pharmacy) is medication to be sent to? [facility] [place]  3. Do they need a 30 day or 90 day supply?

## 2020-01-04 ENCOUNTER — Telehealth: Payer: Self-pay | Admitting: Cardiovascular Disease

## 2020-01-04 MED ORDER — REPATHA SURECLICK 140 MG/ML ~~LOC~~ SOAJ: 140.0000 mg | SUBCUTANEOUS | 3 refills | Status: DC

## 2020-01-04 NOTE — Telephone Encounter (Signed)
°*  STAT* If patient is at the pharmacy, call can be transferred to refill team.   1. Which medications need to be refilled? (please list name of each medication and dose if known) Evolocumab (REPATHA SURECLICK) 140 MG/ML SOAJ  2. Which pharmacy/location (including street and city if local pharmacy) is medication to be sent to? [id number] - [place], [place] - [address]  3. Do they need a 30 day or 90 day supply? 90  

## 2020-04-15 ENCOUNTER — Ambulatory Visit: Payer: Medicare Other | Admitting: Cardiovascular Disease

## 2020-04-15 ENCOUNTER — Telehealth: Payer: Self-pay | Admitting: Cardiovascular Disease

## 2020-04-15 NOTE — Telephone Encounter (Signed)
    Pt c/o medication issue:  1. Name of Medication: Evolocumab (REPATHA SURECLICK) 140 MG/ML SOAJ  2. How are you currently taking this medication (dosage and times per day)?   3. Are you having a reaction (difficulty breathing--STAT)?   4. What is your medication issue? Pt's wife said that pt having  Lot of reaction with statin that's why Dr. Salena Saner prescribed him Repatha, however, pt developed something on his hand and will bee seeing a rheumatologist for it, she is suspecting it is from Repatha. She just wanted to let Dr. Salena Saner know

## 2020-04-15 NOTE — Telephone Encounter (Signed)
Will defer this message to our Pharmacist at [facility] for further review and assistance with pts Repatha questions, for they manage this medication for the pt.

## 2020-04-15 NOTE — Telephone Encounter (Signed)
Spoke with wife - she states that patient has had painful hands for several months and PCP has finally scheduled them to see Rheumatology on April 15.  Wife is concerned that Repatha may be the cause of hand pain, however patient has not had dose of Repatha since January.  Advised her to continue holding med for now and wait until Rheumatology gives them more information.   Wife voiced understanding.

## 2020-04-25 ENCOUNTER — Encounter: Payer: Self-pay | Admitting: Internal Medicine

## 2020-06-15 ENCOUNTER — Ambulatory Visit: Payer: Medicare Other | Admitting: Gastroenterology

## 2020-06-15 ENCOUNTER — Ambulatory Visit: Payer: Medicare Other | Admitting: Nurse Practitioner

## 2020-06-20 ENCOUNTER — Ambulatory Visit (INDEPENDENT_AMBULATORY_CARE_PROVIDER_SITE_OTHER): Payer: Medicare Other | Admitting: Cardiovascular Disease

## 2020-06-20 ENCOUNTER — Encounter: Payer: Self-pay | Admitting: Cardiovascular Disease

## 2020-06-20 ENCOUNTER — Other Ambulatory Visit: Payer: Self-pay

## 2020-06-20 VITALS — BP 120/72 | HR 54 | Resp 18 | Ht 66.0 in | Wt 141.2 lb

## 2020-06-20 DIAGNOSIS — E7800 Pure hypercholesterolemia, unspecified: Secondary | ICD-10-CM | POA: Diagnosis not present

## 2020-06-20 DIAGNOSIS — I2510 Atherosclerotic heart disease of native coronary artery without angina pectoris: Secondary | ICD-10-CM

## 2020-06-20 MED ORDER — REPATHA SURECLICK 140 MG/ML ~~LOC~~ SOAJ: 140.0000 mg | SUBCUTANEOUS | 3 refills | Status: DC

## 2020-06-20 NOTE — Progress Notes (Signed)
Cardiology Office Note    Date:  06/21/2020   ID:  Manuel Mcdowell, DOB 11-Jul-1934, MRN [id number]   PCP:  Jonathon Bellows, DO  Cardiologist:  Marieliz Strang Electrophysiologist:  None   Evaluation Performed:  Follow-Up Visit  Chief Complaint:  CAD  History of Present Illness:    Manuel Mcdowell is a 86 y.o. male with CAD, s/p LAD stent > 15 years ago, without subsequent events or need for revascularization, hypercholesterolemia intolerant to statins, niacin and Zetia.  He continues to be asymptomatic from a cardiac point of view, even when very physically active.  He can split wood with an ax and carry the wood into the house without complaints of chest pain or dyspnea.  He has not had dizziness, syncope, palpitations, leg edema, orthopnea, PND, cough, hemoptysis or wheezing or unexplained weight gain.  Most recent labs showed a LDL cholesterol 53 while taking treatment with Repatha.  Also excellent HDL of 58.  He has had some issues with his hands with burning and numbness in the tips of his fingers and with inability to fully extend his fingers.  On my exam it appears that he has Dupuytren's contractures bilaterally.  He saw a rheumatologist who advised him to stop using the Repatha but who was not familiar with the purpose of that medication.  He is not on beta-blockers due to a history of resting bradycardia  He is no longer taking treatment for inflammatory colitis.  Past Medical History:  Diagnosis Date  . Arrhythmia   . CAD (coronary artery disease) s/p LAD stent 2003 12/06/2012   LAD stent (bare metal BXVelocity 2.5x18), 2003  . CHF (congestive heart failure) (HCC)   . Heart disease   . High cholesterol   . HOH (hard of hearing)   . Hypercholesterolemia    severe  . Hyperlipidemia 12/06/2012   Statin intolerant - myalgia and LFT elevation, Zetia intolerant -myalgia, Niacin intolerant  . Liver disease   . Myocardial infarction (HCC)   . Visual disorder    Past Surgical  History:  Procedure Laterality Date  . CARDIAC CATHETERIZATION     stent place in his LAD artery  . COLONOSCOPY N/A 10/22/2018   Procedure: COLONOSCOPY;  Surgeon: Corbin Ade, MD;  Location: [facility];  Service: Endoscopy;  Laterality: N/A;  10:30am  . ELECTROCARDIOGRAM     normal  . exercise myocardiolite    . Stress Dipyridamole myocardial perfusion  2010     Current Meds  Medication Sig  . aspirin EC 81 MG tablet Take 1 tablet (81 mg total) by mouth daily.  . carboxymethylcellul-glycerin (REFRESH OPTIVE) 0.5-0.9 % ophthalmic solution Place 1 drop into both eyes 3 (three) times daily as needed for dry eyes.  . Cholecalciferol (VITAMIN D-3) 125 MCG (5000 UT) TABS Take 5,000 Units by mouth daily.   . Coenzyme Q10 (COQ10) 100 MG CAPS Take 100 mg by mouth daily.  . diphenhydramine-acetaminophen (TYLENOL PM) 25-500 MG TABS tablet Take 1 tablet by mouth at bedtime.  . Flaxseed, Linseed, (FLAXSEED OIL MAX STR) 1300 MG CAPS Take 2 tablets by mouth daily.   . Melatonin 10 MG TABS Take by mouth at bedtime.  . milk thistle 175 MG tablet Take 525 mg by mouth daily.  . predniSONE (DELTASONE) 5 MG tablet Take 10 mg by mouth daily.  . Probiotic Product (PROBIOTIC DAILY PO) Take 1 capsule by mouth daily.   Marland Kitchen trolamine salicylate (ASPERCREME) 10 % cream Apply 1 application topically at bedtime.  Marland Kitchen  TURMERIC PO Take 2,000 mg by mouth daily.  Marland Kitchen ZINC-VITAMIN C PO Take 1 tablet by mouth daily.     Allergies:   Statins, Metronidazole, Other, and Sulfa antibiotics   Social History   Tobacco Use  . Smoking status: Never Smoker  . Smokeless tobacco: Never Used  Vaping Use  . Vaping Use: Never used  Substance Use Topics  . Alcohol use: Yes    Comment: occas. wine  . Drug use: No     Family Hx: The patient's family history includes Stroke in his father and mother. There is no history of Colon cancer.  ROS:   Please see the history of present illness.    All other systems reviewed and  are negative.   Prior CV studies:   The following studies were reviewed today:  Labs ordered by his primary care provider, Dr. Pernell Dupre in [place].  Labs/Other Tests and Data Reviewed:    EKG: ECG is ordered today and shows sinus bradycardia at 54 bpm otherwise a completely normal tracing  Recent Labs: No results found for requested labs within last 8760 hours.  01/14/2019 Hemoglobin 14.0, potassium 5.2, glucose 98, creatinine 1.1, normal liver function tests February 2022 Cholesterol 140, HDL 58, LDL 53, triglycerides 171, creatinine 0.8, hemoglobin 15.4, glucose 93, normal liver function tests  Recent Lipid Panel Lab Results  Component Value Date/Time   CHOL 134 04/06/2019 08:47 AM   TRIG 139 04/06/2019 08:47 AM   HDL 56 04/06/2019 08:47 AM   CHOLHDL 2.4 04/06/2019 08:47 AM   LDLCALC 54 04/06/2019 08:47 AM     01/14/2019 Total cholesterol 234, HDL 64, triglycerides 139, LDL 147  Wt Readings from Last 3 Encounters:  06/20/20 141 lb 3.2 oz (64 kg)  12/16/19 137 lb 6.4 oz (62.3 kg)  07/15/19 133 lb 6.4 oz (60.5 kg)     Objective:    Vital Signs:  BP 120/72 (BP Location: Left Arm, Patient Position: Sitting, Cuff Size: Normal)   Pulse (!) 54   Resp 18   Ht 5\' 6"  (1.676 m)   Wt 141 lb 3.2 oz (64 kg)   SpO2 94%   BMI 22.79 kg/m     General: Alert, oriented x3, no distress, appears younger than stated age, lean and fit Head: no evidence of trauma, PERRL, EOMI, no exophtalmos or lid lag, no myxedema, no xanthelasma; normal ears, nose and oropharynx Neck: normal jugular venous pulsations and no hepatojugular reflux; brisk carotid pulses without delay and no carotid bruits Chest: clear to auscultation, no signs of consolidation by percussion or palpation, normal fremitus, symmetrical and full respiratory excursions Cardiovascular: normal position and quality of the apical impulse, regular rhythm, normal first and second heart sounds, no murmurs, rubs or gallops Abdomen: no  tenderness or distention, no masses by palpation, no abnormal pulsatility or arterial bruits, normal bowel sounds, no hepatosplenomegaly Extremities: no clubbing, cyanosis or edema; 2+ radial, ulnar and brachial pulses bilaterally; 2+ right femoral, posterior tibial and dorsalis pedis pulses; 2+ left femoral, posterior tibial and dorsalis pedis pulses; no subclavian or femoral bruits He has prominent thickened tendons in the palm of his hand and is unable to fully extend the distal phalanxes of the fingers of both hands.  Also has prominent osteoarthrosis type changes in the distal interphalangeal phalanxes of both hands. Neurological: grossly nonfocal Psych: Normal mood and affect   ASSESSMENT & PLAN:    1. Coronary artery disease involving native coronary artery of native heart without angina pectoris  2. Hypercholesterolemia      1. CAD: On aspirin and Repatha but intolerant to beta-blockers due to bradycardia.  Asymptomatic despite a very physically active lifestyle. 2. HLP: Excellent improvement in lipid profile on Repatha.  Would continue this indefinitely. 3. Probable Dupuytren's contractures: Suggested follow-up with a hand specialty orthopedic surgeon such as Dr. Amanda Pea.   Patient Instructions  Medication Instructions:  No changes *If you need a refill on your cardiac medications before your next appointment, please call your pharmacy*   Lab Work: None ordered If you have labs (blood work) drawn today and your tests are completely normal, you will receive your results only by: Marland Kitchen MyChart Message (if you have MyChart) OR . A paper copy in the mail If you have any lab test that is abnormal or we need to change your treatment, we will call you to review the results.   Testing/Procedures: None ordered   Follow-Up: At [facility], you and your health needs are our priority.  As part of our continuing mission to provide you with exceptional heart care, we have created  designated Provider Care Teams.  These Care Teams include your primary Cardiologist (physician) and Advanced Practice Providers (APPs -  Physician Assistants and Nurse Practitioners) who all work together to provide you with the care you need, when you need it.  We recommend signing up for the patient portal called "MyChart".  Sign up information is provided on this After Visit Summary.  MyChart is used to connect with patients for Virtual Visits (Telemedicine).  Patients are able to view lab/test results, encounter notes, upcoming appointments, etc.  Non-urgent messages can be sent to your provider as well.   To learn more about what you can do with MyChart, go to [email or web address].    Your next appointment:   12 month(s)  The format for your next appointment:   In Person  Provider:   You may see Thurmon Fair, MD or one of the following Advanced Practice Providers on your designated Care Team:    Azalee Course, PA-C  Micah Flesher, [place] or   Judy Pimple, PA-C      Signed, Thurmon Fair, MD  06/21/2020 5:59 PM    [facility]

## 2020-06-20 NOTE — Patient Instructions (Signed)

## 2020-06-21 ENCOUNTER — Telehealth: Payer: Self-pay | Admitting: Cardiovascular Disease

## 2020-06-21 MED ORDER — REPATHA SURECLICK 140 MG/ML ~~LOC~~ SOAJ: 140.0000 mg | SUBCUTANEOUS | 3 refills | Status: DC

## 2020-06-21 NOTE — Telephone Encounter (Signed)
Refill sent in

## 2020-06-21 NOTE — Telephone Encounter (Signed)
*  STAT* If patient is at the pharmacy, call can be transferred to refill team.   1. Which medications need to be refilled? (please list name of each medication and dose if known)   Evolocumab (REPATHA SURECLICK) 140 MG/ML SOAJ     2. Which pharmacy/location (including street and city if local pharmacy) is medication to be sent to? [facility] - [address] [place], [place], [place] [address]  3. Do they need a 30 day or 90 day supply?  90 day supply

## 2020-09-09 ENCOUNTER — Telehealth: Payer: Self-pay | Admitting: Cardiovascular Disease

## 2020-09-09 NOTE — Telephone Encounter (Signed)
Spoke with the pts wife and she wanted to be sure that her PCP is handling his elevated K properly... they called to let him know that his K yesterday at their office was 5.9 and plan t o repeat it today and she is asking what is the cause of the elevated K and I advised her to let her MD know that they use Turmeric and it when searched online notes that it can increase a pts K and she will mention to her PCP.

## 2020-09-09 NOTE — Telephone Encounter (Signed)
Pt's spouse is calling in regards to her husband and she has several questions about with the PCP is telling him to do

## 2020-11-18 ENCOUNTER — Telehealth: Payer: Self-pay | Admitting: Cardiovascular Disease

## 2020-11-18 NOTE — Telephone Encounter (Signed)
Returned patient call and provided information for Dr. Carlos Levering office   Phone number [phone]

## 2020-11-18 NOTE — Telephone Encounter (Signed)
Wife of patient called. Dr. Royann Shivers was going to refer the patient to a Hand doctor. The wife was calling because she has not heard anything from another Doctor yet

## 2021-05-11 ENCOUNTER — Telehealth: Payer: Self-pay | Admitting: Cardiovascular Disease

## 2021-05-11 NOTE — Telephone Encounter (Signed)
Pa approved for 05/11/22 ?

## 2021-05-11 NOTE — Telephone Encounter (Signed)
Follow Up: ? ? ? ? ? ? ?Manuel Mcdowell is calling to see if you received the prior authorization for Repatha? ?

## 2021-10-04 ENCOUNTER — Telehealth: Payer: Self-pay | Admitting: Cardiovascular Disease

## 2021-10-04 MED ORDER — REPATHA SURECLICK 140 MG/ML ~~LOC~~ SOAJ: 140.0000 mg | SUBCUTANEOUS | 3 refills | Status: DC

## 2021-10-04 NOTE — Telephone Encounter (Signed)
*  STAT* If patient is at the pharmacy, call can be transferred to refill team.   1. Which medications need to be refilled? (please list name of each medication and dose if known) Evolocumab (REPATHA SURECLICK) 168 MG/ML SOAJ  2. Which pharmacy/location (including street and city if local pharmacy) is medication to be sent to? [facility] [phone] - [place], [address] AT [place]  3. Do they need a 30 day or 90 day supply? Manuel Mcdowell

## 2022-02-05 ENCOUNTER — Encounter: Payer: Self-pay | Admitting: Cardiovascular Disease

## 2022-02-05 ENCOUNTER — Ambulatory Visit: Payer: Medicare Other | Attending: Cardiovascular Disease | Admitting: Cardiovascular Disease

## 2022-02-05 VITALS — BP 114/68 | HR 62 | Ht 66.0 in | Wt 140.8 lb

## 2022-02-05 DIAGNOSIS — G5603 Carpal tunnel syndrome, bilateral upper limbs: Secondary | ICD-10-CM | POA: Insufficient documentation

## 2022-02-05 DIAGNOSIS — T466X5A Adverse effect of antihyperlipidemic and antiarteriosclerotic drugs, initial encounter: Secondary | ICD-10-CM | POA: Diagnosis present

## 2022-02-05 DIAGNOSIS — G720 Drug-induced myopathy: Secondary | ICD-10-CM | POA: Insufficient documentation

## 2022-02-05 DIAGNOSIS — I2510 Atherosclerotic heart disease of native coronary artery without angina pectoris: Secondary | ICD-10-CM | POA: Insufficient documentation

## 2022-02-05 DIAGNOSIS — E7800 Pure hypercholesterolemia, unspecified: Secondary | ICD-10-CM | POA: Diagnosis not present

## 2022-02-05 MED ORDER — REPATHA SURECLICK 140 MG/ML ~~LOC~~ SOAJ: 140.0000 mg | SUBCUTANEOUS | 3 refills | Status: DC

## 2022-02-05 NOTE — Patient Instructions (Signed)

## 2022-02-08 DIAGNOSIS — T466X5A Adverse effect of antihyperlipidemic and antiarteriosclerotic drugs, initial encounter: Secondary | ICD-10-CM | POA: Insufficient documentation

## 2022-02-08 DIAGNOSIS — G720 Drug-induced myopathy: Secondary | ICD-10-CM | POA: Insufficient documentation

## 2022-02-08 DIAGNOSIS — G5603 Carpal tunnel syndrome, bilateral upper limbs: Secondary | ICD-10-CM | POA: Insufficient documentation

## 2022-02-08 NOTE — Progress Notes (Signed)
Cardiology Office Note    Date:  02/08/2022   ID:  Manuel Mcdowell, DOB [birth year], MRN [id number]   PCP:  Sherrilee Gilles, DO  Cardiologist:  Nalla Purdy Electrophysiologist:  None   Evaluation Performed:  Follow-Up Visit  Chief Complaint:  CAD  History of Present Illness:    Manuel Mcdowell is a 88 y.o. male with CAD, s/p LAD stent > 20 years ago, without subsequent events or need for revascularization, hypercholesterolemia intolerant to statins, niacin and Zetia.  Lipid-lowering is successfully achieved with PCSK9 inhibitor.  He continues to feel very well.  He is very active for his age.  He is able to split wood and carry the wood logs into the house for their wood-burning fireplace without shortness of breath or chest pain.  He denies dizziness, palpitations, syncope, leg edema, orthopnea, PND, focal neurological complaints or intermittent claudication.  Most recent lipid profile showed a slight increase in his LDL cholesterol which is now above target at 79.  In the past LDL cholesterol 73 while taking the same prescription of Repatha.  As always he has an excellent HDL at 60.  Triglycerides are normal.  He had a brief scare when his potassium was reported at 6.4 but this was a lab error (rechecked in the emergency room that was 4.4).  Most recent labs showed a LDL cholesterol 53 while taking treatment with Repatha.  Also excellent HDL of 58.  He is not on beta-blockers due to history of symptomatic bradycardia.  He does take low-dose aspirin.  He is no longer taking treatment for inflammatory colitis.  Past Medical History:  Diagnosis Date   Arrhythmia    CAD (coronary artery disease) s/p LAD stent 2003 12/06/2012   LAD stent (bare metal BXVelocity 2.5x18), 2003   CHF (congestive heart failure) (HCC)    Heart disease    High cholesterol    HOH (hard of hearing)    Hypercholesterolemia    severe   Hyperlipidemia 12/06/2012   Statin intolerant - myalgia and LFT elevation,  Zetia intolerant -myalgia, Niacin intolerant   Liver disease    Myocardial infarction ([place])    Visual disorder    Past Surgical History:  Procedure Laterality Date   CARDIAC CATHETERIZATION     stent place in his LAD artery   COLONOSCOPY N/A 10/22/2018   Procedure: COLONOSCOPY;  Surgeon: Daneil Dolin, MD;  [facility];  Service: Endoscopy;  Laterality: N/A;  10:30am   ELECTROCARDIOGRAM     normal   exercise myocardiolite     Stress Dipyridamole myocardial perfusion  2010     Current Meds  Medication Sig   aspirin EC 81 MG tablet Take 1 tablet (81 mg total) by mouth daily.   carboxymethylcellul-glycerin (REFRESH OPTIVE) 0.5-0.9 % ophthalmic solution Place 1 drop into both eyes 3 (three) times daily as needed for dry eyes.   Cholecalciferol (VITAMIN D-3) 125 MCG (5000 UT) TABS Take 5,000 Units by mouth daily.    Coenzyme Q10 (COQ10) 100 MG CAPS Take 100 mg by mouth daily.   diphenhydramine-acetaminophen (TYLENOL PM) 25-500 MG TABS tablet Take 1 tablet by mouth at bedtime.   Flaxseed, Linseed, (FLAXSEED OIL MAX STR) 1300 MG CAPS Take 2 tablets by mouth daily.    Melatonin 10 MG TABS Take by mouth at bedtime.   milk thistle 175 MG tablet Take 525 mg by mouth daily.   predniSONE (DELTASONE) 5 MG tablet Take 10 mg by mouth daily.   Probiotic Product (PROBIOTIC DAILY  PO) Take 1 capsule by mouth daily.    trolamine salicylate (ASPERCREME) 10 % cream Apply 1 application topically at bedtime.   TURMERIC PO Take 2,000 mg by mouth daily.   ZINC-VITAMIN C PO Take 1 tablet by mouth daily.   [DISCONTINUED] Evolocumab (REPATHA SURECLICK) 222 MG/ML SOAJ Inject 140 mg into the skin every 14 (fourteen) days.     Allergies:   Statins, Metronidazole, Other, and Sulfa antibiotics   Social History   Tobacco Use   Smoking status: Never   Smokeless tobacco: Never  Vaping Use   Vaping Use: Never used  Substance Use Topics   Alcohol use: Yes    Comment: occas. wine   Drug use: No      Family Hx: The patient's family history includes Stroke in his father and mother. There is no history of Colon cancer.  ROS:   Please see the history of present illness.    All other systems reviewed and are negative.   Prior CV studies:   The following studies were reviewed today:  Labs ordered by his primary care provider, Dr. Andree Elk in [place].  Labs/Other Tests and Data Reviewed:    EKG: ECG is ordered today and shows normal sinus rhythm, normal tracing  Recent Labs: No results found for requested labs within last 365 days.  01/14/2019 Hemoglobin 14.0, potassium 5.2, glucose 98, creatinine 1.1, normal liver function tests February 2022 Cholesterol 140, HDL 58, LDL 53, triglycerides 171, creatinine 0.8, hemoglobin 15.4, glucose 93, normal liver function tests  01/30/2022 Hemoglobin 15.3, potassium 6.4!  (Recheck 4.4), creatinine 0.9, AST 23 Cholesterol 155, triglycerides 94, HDL 60, LDL 79  Recent Lipid Panel Lab Results  Component Value Date/Time   CHOL 134 04/06/2019 08:47 AM   TRIG 139 04/06/2019 08:47 AM   HDL 56 04/06/2019 08:47 AM   CHOLHDL 2.4 04/06/2019 08:47 AM   LDLCALC 54 04/06/2019 08:47 AM     01/14/2019 Total cholesterol 234, HDL 64, triglycerides 139, LDL 147  Wt Readings from Last 3 Encounters:  02/05/22 140 lb 12.8 oz (63.9 kg)  06/20/20 141 lb 3.2 oz (64 kg)  12/16/19 137 lb 6.4 oz (62.3 kg)     Objective:    Vital Signs:  BP 114/68   Pulse 62   Ht 5\' 6"  (1.676 m)   Wt 140 lb 12.8 oz (63.9 kg)   SpO2 98%   BMI 22.73 kg/m     General: Alert, oriented x3, no distress, appears lean, fit and younger than stated age Head: no evidence of trauma, PERRL, EOMI, no exophtalmos or lid lag, no myxedema, no xanthelasma; normal ears, nose and oropharynx Neck: normal jugular venous pulsations and no hepatojugular reflux; brisk carotid pulses without delay and no carotid bruits Chest: clear to auscultation, no signs of consolidation by percussion  or palpation, normal fremitus, symmetrical and full respiratory excursions Cardiovascular: normal position and quality of the apical impulse, regular rhythm, normal first and second heart sounds, no murmurs, rubs or gallops Abdomen: no tenderness or distention, no masses by palpation, no abnormal pulsatility or arterial bruits, normal bowel sounds, no hepatosplenomegaly Extremities: no clubbing, cyanosis or edema; 2+ radial, ulnar and brachial pulses bilaterally; 2+ right femoral, posterior tibial and dorsalis pedis pulses; 2+ left femoral, posterior tibial and dorsalis pedis pulses; no subclavian or femoral bruits Neurological: grossly nonfocal Psych: Normal mood and affect   ASSESSMENT & PLAN:    1. Coronary artery disease involving native coronary artery of native heart without angina pectoris  2. Hypercholesterolemia   3. Bilateral carpal tunnel syndrome   4. Statin myopathy      CAD: On aspirin and Repatha but intolerant to beta-blockers due to bradycardia.  He is very physically active and remains asymptomatic.  More than 20 years have passed since he had a revascularization procedure. HLP: I am not sure why, but his LDL cholesterol, which had consistently been less than 60 since starting Repatha is a little higher this year.  No changes are recommended to his medications yet.  He is intolerant to multiple statins as well as to  Zetia.  Focus on continuing to eat a healthy diet and remain physically active. Bilateral carpal tunnel syndrome: He saw Dr. Amanda Pea in the orthopedic clinic about a year ago and it was felt that he had significant bilateral carpal tunnel syndrome, particularly bad on the right side.  He does not have other features that suggest amyloidosis.  ECG shows pretty normal voltage.  No heart failure or neuropathy symptoms (except as associated with the carpal tunnel syndrome).  Patient Instructions  Medication Instructions:  No changes *If you need a refill on your  cardiac medications before your next appointment, please call your pharmacy*  Follow-Up: At [facility], you and your health needs are our priority.  As part of our continuing mission to provide you with exceptional heart care, we have created designated Provider Care Teams.  These Care Teams include your primary Cardiologist (physician) and Advanced Practice Providers (APPs -  Physician Assistants and Nurse Practitioners) who all work together to provide you with the care you need, when you need it.  We recommend signing up for the patient portal called "MyChart".  Sign up information is provided on this After Visit Summary.  MyChart is used to connect with patients for Virtual Visits (Telemedicine).  Patients are able to view lab/test results, encounter notes, upcoming appointments, etc.  Non-urgent messages can be sent to your provider as well.   To learn more about what you can do with MyChart, go to [email or web address].    Your next appointment:   1 year(s)  Provider:   Thurmon Fair, MD      Signed, Thurmon Fair, MD  02/08/2022 5:30 PM    [facility]

## 2022-05-25 ENCOUNTER — Other Ambulatory Visit (HOSPITAL_COMMUNITY): Payer: Self-pay

## 2022-09-27 ENCOUNTER — Telehealth: Payer: Self-pay | Admitting: Cardiovascular Disease

## 2022-09-27 NOTE — Telephone Encounter (Signed)
Patient's wife is calling to provide a fax number for [facility] in [place]. She would like to have any lab orders faxed to them at [phone]. Patient's wife also provided a phone number, [phone].

## 2022-09-27 NOTE — Telephone Encounter (Signed)
Duplicate encounter

## 2023-01-11 ENCOUNTER — Telehealth: Payer: Self-pay | Admitting: Cardiovascular Disease

## 2023-01-11 DIAGNOSIS — E7800 Pure hypercholesterolemia, unspecified: Secondary | ICD-10-CM

## 2023-01-11 DIAGNOSIS — I2510 Atherosclerotic heart disease of native coronary artery without angina pectoris: Secondary | ICD-10-CM

## 2023-01-11 DIAGNOSIS — Z79899 Other long term (current) drug therapy: Secondary | ICD-10-CM

## 2023-01-11 NOTE — Telephone Encounter (Signed)
Patient identification verified by 2 forms. Manuel Rail, RN    Received call from patients wife Manuel Mcdowell states:   -would like lab orders faxed to [facility]   -patient has appointment 02/01/23  -plans to present to lab 1 week prior to appointment  Informed Manuel Mcdowell:   -labs would need to be completed at [facility]   -[facility] located at [address]. [place]   -currently no labs on file, will message Dr. Royann Mcdowell for assistance  Manuel Mcdowell has no further questions at this time

## 2023-01-11 NOTE — Telephone Encounter (Signed)
Wife Manuel Mcdowell) called to have lab orders sent to [facility], [address], [place], [place] [address] phone# [phone].

## 2023-01-14 NOTE — Telephone Encounter (Signed)
Croitoru, Mihai, MD  Quintella Reichert (12:12 PM)    Please order CMET, CBC and fasting lipid profile.   Called and spoke with [name] [facility]) she states that her and her husband both have appointments on 02/01/23 and would like to get lab work a week before the appointment. She states that she can go to [facility] in [place], [place]- feels it would be easier since they are in the same lab system as opposed to going to [facility] who is not in the same system with [facility]. Informed her that I will place orders and release them, so Any [facility] can see the orders. Told her that she can go to any [facility] that is convenient to them. Informed that they will need to be fasting- nothing to eat or drink after midnight- water is ok. She verbalized understanding.   Orders placed and released

## 2023-01-25 ENCOUNTER — Telehealth: Payer: Self-pay | Admitting: Cardiovascular Disease

## 2023-01-25 NOTE — Telephone Encounter (Signed)
 Lab orders faxed to [facility] at [phone].

## 2023-01-25 NOTE — Telephone Encounter (Signed)
 Pt spouse called in stating she went to [facility] and they couldn't find her vein so she is on the way to [facility] now to get her labs drawn and she would like the orders faxed over there for her and her husband.   [facility] phone #:[phone], I was unable to find fax number.

## 2023-01-28 ENCOUNTER — Encounter: Payer: Self-pay | Admitting: Cardiovascular Disease

## 2023-01-28 LAB — LAB REPORT - SCANNED: EGFR: 83

## 2023-02-01 ENCOUNTER — Ambulatory Visit: Payer: Medicare Other | Attending: Cardiovascular Disease | Admitting: Cardiovascular Disease

## 2023-02-01 ENCOUNTER — Encounter: Payer: Self-pay | Admitting: Cardiovascular Disease

## 2023-02-01 VITALS — BP 134/62 | HR 61 | Ht 66.0 in | Wt 131.6 lb

## 2023-02-01 DIAGNOSIS — E7800 Pure hypercholesterolemia, unspecified: Secondary | ICD-10-CM | POA: Diagnosis present

## 2023-02-01 DIAGNOSIS — I2510 Atherosclerotic heart disease of native coronary artery without angina pectoris: Secondary | ICD-10-CM | POA: Diagnosis present

## 2023-02-01 NOTE — Patient Instructions (Signed)

## 2023-02-01 NOTE — Progress Notes (Unsigned)
Cardiology Office Note    Date:  02/02/2023   ID:  Manuel Mcdowell, DOB January 06, 1935, MRN [id number]   PCP:  Jonathon Bellows, DO  Cardiologist:  Isabel Freese Electrophysiologist:  None   Evaluation Performed:  Follow-Up Visit  Chief Complaint:  CAD  History of Present Illness:    Manuel Mcdowell is a 89 y.o. male with CAD, s/p LAD stent > 20 years ago, without subsequent events or need for revascularization, hypercholesterolemia intolerant to statins, niacin and Zetia.  Lipid-lowering is successfully achieved with PCSK9 inhibitor.  He remains very physically active without any cardiovascular complaints.  He splits wood and carries the logs into the house for his wood-burning fireplace. The patient specifically denies any chest pain at rest or with exertion, dyspnea at rest or with exertion, orthopnea, paroxysmal nocturnal dyspnea, syncope, palpitations, focal neurological deficits, intermittent claudication, lower extremity edema, unexplained weight gain, cough, hemoptysis or wheezing.   He does not eat much.  In the morning he may have some peanut butter with banana and then usually does not have a lunch.  He is quite lean with a BMI of only 21.  He has lost 9 pounds in the last 12 months.  Having said that he was only about 2 or 3 pounds heavier than this back in 2020.  His routine labs are normal and his lipid profile is very good with a total cholesterol 152, triglycerides 89, HDL 61, LDL 73.  He is not on beta-blockers due to history of symptomatic bradycardia.  He does take low-dose aspirin.  Past Medical History:  Diagnosis Date   Arrhythmia    CAD (coronary artery disease) s/p LAD stent 2003 12/06/2012   LAD stent (bare metal BXVelocity 2.5x18), 2003   CHF (congestive heart failure) (HCC)    Heart disease    High cholesterol    HOH (hard of hearing)    Hypercholesterolemia    severe   Hyperlipidemia 12/06/2012   Statin intolerant - myalgia and LFT elevation, Zetia intolerant  -myalgia, Niacin intolerant   Liver disease    Myocardial infarction (HCC)    Visual disorder    Past Surgical History:  Procedure Laterality Date   CARDIAC CATHETERIZATION     stent place in his LAD artery   COLONOSCOPY N/A 10/22/2018   Procedure: COLONOSCOPY;  Surgeon: Corbin Ade, MD;  [facility];  Service: Endoscopy;  Laterality: N/A;  10:30am   ELECTROCARDIOGRAM     normal   exercise myocardiolite     Stress Dipyridamole myocardial perfusion  2010     Current Meds  Medication Sig   aspirin EC 81 MG tablet Take 1 tablet (81 mg total) by mouth daily.   carboxymethylcellul-glycerin (REFRESH OPTIVE) 0.5-0.9 % ophthalmic solution Place 1 drop into both eyes 3 (three) times daily as needed for dry eyes.   Cholecalciferol (VITAMIN D-3) 125 MCG (5000 UT) TABS Take 5,000 Units by mouth daily.    Coenzyme Q10 (COQ10) 100 MG CAPS Take 100 mg by mouth daily.   diphenhydramine-acetaminophen (TYLENOL PM) 25-500 MG TABS tablet Take 1 tablet by mouth at bedtime.   Evolocumab (REPATHA SURECLICK) 140 MG/ML SOAJ Inject 140 mg into the skin every 14 (fourteen) days.   prednisoLONE acetate (PRED FORTE) 1 % ophthalmic suspension Place 1 drop into both eyes 2 (two) times daily.   trolamine salicylate (ASPERCREME) 10 % cream Apply 1 application topically at bedtime.     Allergies:   Statins, Metronidazole, Other, and Sulfa antibiotics   Social  History   Tobacco Use   Smoking status: Never   Smokeless tobacco: Never  Vaping Use   Vaping status: Never Used  Substance Use Topics   Alcohol use: Yes    Comment: occas. wine   Drug use: No     Family Hx: The patient's family history includes Stroke in his father and mother. There is no history of Colon cancer.  ROS:   Please see the history of present illness.    All other systems reviewed and are negative.   Prior CV studies:   The following studies were reviewed today:  Labs ordered by his primary care provider, Dr.  Pernell Dupre in [place].  Labs/Other Tests and Data Reviewed:    EKG: ECG is ordered today and shows normal sinus rhythm, normal tracing  Recent Labs: No results found for requested labs within last 365 days.  01/14/2019 Hemoglobin 14.0, potassium 5.2, glucose 98, creatinine 1.1, normal liver function tests February 2022 Cholesterol 140, HDL 58, LDL 53, triglycerides 171, creatinine 0.8, hemoglobin 15.4, glucose 93, normal liver function tests  01/30/2022 Hemoglobin 15.3, potassium 6.4!  (Recheck 4.4), creatinine 0.9, AST 23   01/28/2023 Creatinine 0.89, potassium 5.0, normal liver function tests  Recent Lipid Panel Lab Results  Component Value Date/Time   CHOL 134 04/06/2019 08:47 AM   TRIG 139 04/06/2019 08:47 AM   HDL 56 04/06/2019 08:47 AM   CHOLHDL 2.4 04/06/2019 08:47 AM   LDLCALC 54 04/06/2019 08:47 AM     01/14/2019 Total cholesterol 234, HDL 64, triglycerides 139, LDL 147  01/30/2022 Cholesterol 155, triglycerides 94, HDL 60, LDL 79  01/28/2023 total cholesterol 152, triglycerides 89, HDL 61 LDL 73  Wt Readings from Last 3 Encounters:  02/01/23 131 lb 9.6 oz (59.7 kg)  02/05/22 140 lb 12.8 oz (63.9 kg)  06/20/20 141 lb 3.2 oz (64 kg)     Objective:    Vital Signs:  BP 134/62 (BP Location: Left Arm, Patient Position: Sitting)   Pulse 61   Ht 5\' 6"  (1.676 m)   Wt 131 lb 9.6 oz (59.7 kg)   SpO2 96%   BMI 21.24 kg/m     General: Alert, oriented x3, no distress, appears lean, fit and younger than stated age Head: no evidence of trauma, PERRL, EOMI, no exophtalmos or lid lag, no myxedema, no xanthelasma; normal ears, nose and oropharynx Neck: normal jugular venous pulsations and no hepatojugular reflux; brisk carotid pulses without delay and no carotid bruits Chest: clear to auscultation, no signs of consolidation by percussion or palpation, normal fremitus, symmetrical and full respiratory excursions Cardiovascular: normal position and quality of the apical  impulse, regular rhythm, normal first and second heart sounds, no murmurs, rubs or gallops Abdomen: no tenderness or distention, no masses by palpation, no abnormal pulsatility or arterial bruits, normal bowel sounds, no hepatosplenomegaly Extremities: no clubbing, cyanosis or edema; 2+ radial, ulnar and brachial pulses bilaterally; 2+ right femoral, posterior tibial and dorsalis pedis pulses; 2+ left femoral, posterior tibial and dorsalis pedis pulses; no subclavian or femoral bruits Neurological: grossly nonfocal Psych: Normal mood and affect   ASSESSMENT & PLAN:    1. Coronary artery disease involving native coronary artery of native heart without angina pectoris   2. Hypercholesterolemia      CAD: On antiplatelet and lipid-lowering therapy, but intolerant to beta-blockers due to bradycardia.  He is very physically active and remains asymptomatic.  More than 20 years have passed since he had a revascularization procedure. HLP: LDL very close  to target.  Continue Repatha.  Did not tolerate statins due to myopathy.   Patient Instructions  Medication Instructions:  No changes *If you need a refill on your cardiac medications before your next appointment, please call your pharmacy*  Follow-Up: At [facility], you and your health needs are our priority.  As part of our continuing mission to provide you with exceptional heart care, we have created designated Provider Care Teams.  These Care Teams include your primary Cardiologist (physician) and Advanced Practice Providers (APPs -  Physician Assistants and Nurse Practitioners) who all work together to provide you with the care you need, when you need it.  We recommend signing up for the patient portal called "MyChart".  Sign up information is provided on this After Visit Summary.  MyChart is used to connect with patients for Virtual Visits (Telemedicine).  Patients are able to view lab/test results, encounter notes, upcoming  appointments, etc.  Non-urgent messages can be sent to your provider as well.   To learn more about what you can do with MyChart, go to [email or web address].    Your next appointment:   1 year(s)  Provider:   Thurmon Fair, MD            Signed, Thurmon Fair, MD  02/02/2023 5:38 PM    [facility]

## 2023-02-04 ENCOUNTER — Other Ambulatory Visit: Payer: Self-pay | Admitting: Cardiovascular Disease

## 2023-02-11 ENCOUNTER — Other Ambulatory Visit (HOSPITAL_COMMUNITY): Payer: Self-pay

## 2023-02-11 ENCOUNTER — Telehealth: Payer: Self-pay | Admitting: Pharmacy Technician

## 2023-02-11 ENCOUNTER — Telehealth: Payer: Self-pay | Admitting: Pharmacist

## 2023-02-11 NOTE — Telephone Encounter (Signed)
Pharmacy Patient Advocate Encounter  Received notification from [employer] that Prior Authorization for Repatha 140mg /ml has been APPROVED from 01/16/23 to 02/11/24. Spoke to pharmacy to process.Copay is $40.00 for the partial, they have to order the reminder and she said she doenst know what the full copay would be until the full amount came in.   CaseId:[id number];Status:Approved;Review Type:Prior Auth;Coverage Start Date:01/16/2023;Coverage End Date:02/11/2024; Authorization Expiration Date: 02/10/2024  PA #/Case ID/Reference #: [id number]

## 2023-02-11 NOTE — Telephone Encounter (Signed)
PA request has been Approved. New Encounter created for follow up. For additional info see Pharmacy Prior Auth telephone encounter from 02/11/23.   I am on the phone with his pharmacy now for them to submit it.

## 2023-02-11 NOTE — Telephone Encounter (Signed)
Repatha PA request sent to the pool.

## 2023-11-22 ENCOUNTER — Telehealth: Payer: Self-pay | Admitting: Cardiovascular Disease

## 2023-11-22 MED ORDER — REPATHA SURECLICK 140 MG/ML ~~LOC~~ SOAJ: 140.0000 mg | SUBCUTANEOUS | 3 refills | Status: AC

## 2023-11-22 NOTE — Telephone Encounter (Signed)
*  STAT* If patient is at the pharmacy, call can be transferred to refill team.   1. Which medications need to be refilled? (please list name of each medication and dose if known) REPATHA  SURECLICK 140 MG/ML SOAJ    2. Would you like to learn more about the convenience, safety, & potential cost savings by using the [facility]? No   3. Are you open to using the [facility] ([facility] No   4. Which pharmacy/location (including street and city if local pharmacy) is medication to be sent to?  [phone] - [place], [place] - [address]   5. Do they need a 30 day or 90 day supply?   Pt was to take injection on this past Wednesday but was unable to being that he is out of medication

## 2023-11-22 NOTE — Telephone Encounter (Signed)
 Refill sent in

## 2023-12-03 ENCOUNTER — Telehealth: Payer: Self-pay | Admitting: Cardiovascular Disease

## 2023-12-03 NOTE — Telephone Encounter (Signed)
*  STAT* If patient is at the pharmacy, call can be transferred to refill team.   1. Which medications need to be refilled? (please list name of each medication and dose if known)   Evolocumab  (REPATHA  SURECLICK) 140 MG/ML SOAJ     2. Would you like to learn more about the convenience, safety, & potential cost savings by using the [facility]? No    3. Are you open to using the [facility] ([facility]no    4. Which pharmacy/location (including street and city if local pharmacy) is medication to be sent to?[phone] [place], [place] - [address]    5. Do they need a 30 day or 90 day supply? 90 day   Pt would like a new RX with 90 day supply along with 3 refills for the year if possible

## 2023-12-03 NOTE — Telephone Encounter (Signed)
 Disp Refills Start End   Evolocumab  (REPATHA  SURECLICK) 140 MG/ML SOAJ 6 mL 3 11/22/2023 --   Sig - Route: Inject 140 mg into the skin every 14 (fourteen) days. - Subcutaneous   Sent to pharmacy as: Evolocumab  (REPATHA  SURECLICK) 140 MG/ML Solution Auto-injector   E-Prescribing Status: Receipt confirmed by pharmacy (11/22/2023  2:30 PM EST)    Pharmacy  [id number] [place], [place] - [address] AT [place] [place]    LM that med was sent to pharmacy on 11/22/23

## 2023-12-06 NOTE — Telephone Encounter (Signed)
 Wife called back to say they need prescription for a 90 day supple. States for insurance purposes it and less out of pocket. Please advise

## 2024-02-17 ENCOUNTER — Ambulatory Visit: Admitting: Cardiovascular Disease

## 2024-06-12 ENCOUNTER — Ambulatory Visit: Admitting: Cardiovascular Disease
